# Patient Record
Sex: Female | Born: 1990 | Race: Black or African American | Hispanic: No | Marital: Single | Smoking: Former smoker
Health system: Southern US, Community
[De-identification: ages and names within clinical notes are randomized; demographics above are authoritative.]

## PROBLEM LIST (undated history)

## (undated) DIAGNOSIS — Z789 Other specified health status: Secondary | ICD-10-CM

## (undated) HISTORY — PX: WISDOM TOOTH EXTRACTION: SHX21

---

## 2011-09-13 ENCOUNTER — Emergency Department (HOSPITAL_COMMUNITY): Payer: Medicaid Other

## 2011-09-13 ENCOUNTER — Emergency Department (HOSPITAL_COMMUNITY)
Admission: EM | Admit: 2011-09-13 | Discharge: 2011-09-13 | Disposition: A | Discharge status: Home / Self Care | Payer: Medicaid Other | Attending: Emergency Medicine | Admitting: Emergency Medicine

## 2011-09-13 DIAGNOSIS — R6889 Other general symptoms and signs: Secondary | ICD-10-CM | POA: Insufficient documentation

## 2011-09-13 DIAGNOSIS — R634 Abnormal weight loss: Secondary | ICD-10-CM | POA: Insufficient documentation

## 2011-09-13 DIAGNOSIS — J069 Acute upper respiratory infection, unspecified: Secondary | ICD-10-CM

## 2011-09-13 DIAGNOSIS — J3489 Other specified disorders of nose and nasal sinuses: Secondary | ICD-10-CM | POA: Insufficient documentation

## 2011-09-13 DIAGNOSIS — R070 Pain in throat: Secondary | ICD-10-CM | POA: Insufficient documentation

## 2011-09-13 DIAGNOSIS — R059 Cough, unspecified: Secondary | ICD-10-CM | POA: Insufficient documentation

## 2011-09-13 DIAGNOSIS — Z79899 Other long term (current) drug therapy: Secondary | ICD-10-CM | POA: Insufficient documentation

## 2011-09-13 DIAGNOSIS — R05 Cough: Secondary | ICD-10-CM | POA: Insufficient documentation

## 2011-09-13 LAB — RAPID STREP SCREEN (MED CTR MEBANE ONLY): Streptococcus, Group A Screen (Direct): NEGATIVE

## 2011-09-13 MED ORDER — ALBUTEROL SULFATE HFA 108 (90 BASE) MCG/ACT IN AERS
2.0000 | INHALATION_SPRAY | Freq: Four times a day (QID) | RESPIRATORY_TRACT | Status: DC | PRN
  Administered 2011-09-13: 2 via RESPIRATORY_TRACT
  Filled 2011-09-13: qty 6.7

## 2011-09-13 MED ORDER — AEROCHAMBER PLUS W/MASK MISC
Status: AC
  Administered 2011-09-13: 13:00:00
  Filled 2011-09-13: qty 1

## 2011-09-13 MED ORDER — IBUPROFEN 800 MG PO TABS: 800.0000 mg | ORAL_TABLET | Freq: Three times a day (TID) | ORAL | Status: AC

## 2011-09-13 NOTE — ED Notes (Signed)
Pt ambulatory to xray.
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
## 2011-09-16 NOTE — ED Provider Notes (Signed)
Medical screening examination/treatment/procedure(s) were performed by non-physician practitioner and as supervising physician I was immediately available for consultation/collaboration.  Nicholes Stairs, MD 09/16/11 1610

## 2012-01-09 ENCOUNTER — Emergency Department (HOSPITAL_COMMUNITY)
Admission: EM | Admit: 2012-01-09 | Discharge: 2012-01-09 | Disposition: A | Discharge status: Home / Self Care | Payer: Medicaid Other | Attending: Emergency Medicine | Admitting: Emergency Medicine

## 2012-01-09 ENCOUNTER — Emergency Department (HOSPITAL_COMMUNITY): Payer: Medicaid Other

## 2012-01-09 ENCOUNTER — Encounter (HOSPITAL_COMMUNITY): Payer: Self-pay | Admitting: *Deleted

## 2012-01-09 DIAGNOSIS — H53149 Visual discomfort, unspecified: Secondary | ICD-10-CM | POA: Insufficient documentation

## 2012-01-09 DIAGNOSIS — R51 Headache: Secondary | ICD-10-CM | POA: Insufficient documentation

## 2012-01-09 DIAGNOSIS — H1130 Conjunctival hemorrhage, unspecified eye: Secondary | ICD-10-CM | POA: Insufficient documentation

## 2012-01-09 DIAGNOSIS — S0003XA Contusion of scalp, initial encounter: Secondary | ICD-10-CM | POA: Insufficient documentation

## 2012-01-09 DIAGNOSIS — W503XXA Accidental bite by another person, initial encounter: Secondary | ICD-10-CM

## 2012-01-09 DIAGNOSIS — S01512A Laceration without foreign body of oral cavity, initial encounter: Secondary | ICD-10-CM

## 2012-01-09 DIAGNOSIS — S0510XA Contusion of eyeball and orbital tissues, unspecified eye, initial encounter: Secondary | ICD-10-CM

## 2012-01-09 DIAGNOSIS — S0990XA Unspecified injury of head, initial encounter: Secondary | ICD-10-CM | POA: Insufficient documentation

## 2012-01-09 DIAGNOSIS — S63619A Unspecified sprain of unspecified finger, initial encounter: Secondary | ICD-10-CM

## 2012-01-09 DIAGNOSIS — R220 Localized swelling, mass and lump, head: Secondary | ICD-10-CM | POA: Insufficient documentation

## 2012-01-09 DIAGNOSIS — S0083XA Contusion of other part of head, initial encounter: Secondary | ICD-10-CM

## 2012-01-09 DIAGNOSIS — S6390XA Sprain of unspecified part of unspecified wrist and hand, initial encounter: Secondary | ICD-10-CM | POA: Insufficient documentation

## 2012-01-09 DIAGNOSIS — M79609 Pain in unspecified limb: Secondary | ICD-10-CM | POA: Insufficient documentation

## 2012-01-09 DIAGNOSIS — S1093XA Contusion of unspecified part of neck, initial encounter: Secondary | ICD-10-CM | POA: Insufficient documentation

## 2012-01-09 DIAGNOSIS — S01502A Unspecified open wound of oral cavity, initial encounter: Secondary | ICD-10-CM | POA: Insufficient documentation

## 2012-01-09 DIAGNOSIS — S6990XA Unspecified injury of unspecified wrist, hand and finger(s), initial encounter: Secondary | ICD-10-CM | POA: Insufficient documentation

## 2012-01-09 DIAGNOSIS — H538 Other visual disturbances: Secondary | ICD-10-CM | POA: Insufficient documentation

## 2012-01-09 DIAGNOSIS — S61209A Unspecified open wound of unspecified finger without damage to nail, initial encounter: Secondary | ICD-10-CM | POA: Insufficient documentation

## 2012-01-09 MED ORDER — TETANUS-DIPHTHERIA TOXOIDS TD 5-2 LFU IM INJ
0.5000 mL | INJECTION | Freq: Once | INTRAMUSCULAR | Status: AC
  Administered 2012-01-09: 0.5 mL via INTRAMUSCULAR
  Filled 2012-01-09 (×2): qty 0.5

## 2012-01-09 MED ORDER — AMOXICILLIN-POT CLAVULANATE 875-125 MG PO TABS: 1.0000 | ORAL_TABLET | Freq: Two times a day (BID) | ORAL | Status: AC

## 2012-01-09 MED ORDER — FLUORESCEIN SODIUM 1 MG OP STRP
2.0000 | ORAL_STRIP | Freq: Once | OPHTHALMIC | Status: AC
  Administered 2012-01-09: 2 via OPHTHALMIC
  Filled 2012-01-09: qty 1

## 2012-01-09 MED ORDER — LIDOCAINE HCL (PF) 1 % IJ SOLN
5.0000 mL | Freq: Once | INTRAMUSCULAR | Status: DC
  Filled 2012-01-09: qty 5

## 2012-01-09 MED ORDER — IBUPROFEN 600 MG PO TABS: 600.0000 mg | ORAL_TABLET | Freq: Three times a day (TID) | ORAL | Status: DC | PRN

## 2012-01-09 MED ORDER — TETRACAINE HCL 0.5 % OP SOLN
2.0000 [drp] | Freq: Once | OPHTHALMIC | Status: AC
  Administered 2012-01-09: 2 [drp] via OPHTHALMIC
  Filled 2012-01-09: qty 2
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
## 2012-01-09 NOTE — ED Notes (Signed)
Patient transported to X-ray 
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
## 2012-01-09 NOTE — ED Notes (Signed)
Discharge inst given  Voiced understanding 
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
## 2012-12-07 ENCOUNTER — Encounter (HOSPITAL_COMMUNITY): Payer: Self-pay | Admitting: *Deleted

## 2012-12-07 ENCOUNTER — Emergency Department (HOSPITAL_COMMUNITY)
Admission: EM | Admit: 2012-12-07 | Discharge: 2012-12-07 | Discharge status: Against Medical Advice | Payer: Medicaid Other | Attending: Emergency Medicine | Admitting: Emergency Medicine

## 2012-12-07 DIAGNOSIS — J039 Acute tonsillitis, unspecified: Secondary | ICD-10-CM | POA: Insufficient documentation

## 2012-12-07 NOTE — ED Notes (Signed)
Pt reports having swollen tonsils x several weeks.  Denies pain.  Tonsils noted to be red, swollen and white exudate present.

## 2013-01-27 DIAGNOSIS — J3501 Chronic tonsillitis: Secondary | ICD-10-CM | POA: Insufficient documentation

## 2013-01-28 ENCOUNTER — Emergency Department (HOSPITAL_COMMUNITY)
Admission: EM | Admit: 2013-01-28 | Discharge: 2013-01-28 | Disposition: A | Discharge status: Home / Self Care | Payer: Medicaid Other | Attending: Emergency Medicine | Admitting: Emergency Medicine

## 2013-01-28 ENCOUNTER — Encounter (HOSPITAL_COMMUNITY): Payer: Self-pay | Admitting: *Deleted

## 2013-01-28 DIAGNOSIS — J3501 Chronic tonsillitis: Secondary | ICD-10-CM

## 2013-01-28 MED ORDER — DEXAMETHASONE 2 MG PO TABS
10.0000 mg | ORAL_TABLET | Freq: Once | ORAL | Status: AC
  Administered 2013-01-28: 10 mg via ORAL
  Filled 2013-01-28: qty 5

## 2013-01-28 NOTE — ED Notes (Signed)
Patient says she had an allergic reaction to shellfish and she was given antihistamine, and steroids because her throat was getting swollen.  So she went to the emergency department in [facility].  The patient says her tonsils never went back to normal.  Tonight she noticed her tonsils were touching each other and she got concerned.  She says her throa is not sore and she did not have any shellfish.

## 2013-01-28 NOTE — ED Notes (Signed)
Patient is alert and orientedx4.  Patient was explained discharge instructions and they understood them with no questions.   

## 2013-01-28 NOTE — ED Provider Notes (Signed)
History     CSN: [id number]  Arrival date & time 01/27/13  2357   First MD Initiated Contact with Patient 01/28/13 0019      Chief Complaint  Patient presents with  . Sore Throat    (Consider location/radiation/quality/duration/timing/severity/associated sxs/prior treatment) Patient is a 22 y.o. female presenting with pharyngitis. The history is provided by the patient.  Sore Throat The problem has been gradually worsening. Pertinent negatives include no chest pain, congestion, nausea or sore throat. Associated symptoms comments: She presents for evaluation of increased swelling to tonsils bilaterally. It began 2 months ago after being diagnosed with an allergic reaction to shellfish. She has had gradual and progressive swelling since that time. No difficulty swallowing solids or liquids tonight. No difficulty breathing or shortness of breath. Swelling is limited to tonsils without involvement of lips, tongue. Marland Kitchen    History reviewed. No pertinent past medical history.  History reviewed. No pertinent past surgical history.  History reviewed. No pertinent family history.  History  Substance Use Topics  . Smoking status: Not on file  . Smokeless tobacco: Not on file  . Alcohol Use: Yes    OB History   Grav Para Term Preterm Abortions TAB SAB Ect Mult Living                  Review of Systems  HENT: Negative for congestion, sore throat, drooling and trouble swallowing.        See HPI.  Respiratory: Negative for shortness of breath, wheezing and stridor.   Cardiovascular: Negative for chest pain.  Gastrointestinal: Negative for nausea.    Allergies  Shellfish allergy  Home Medications   Current Outpatient Rx  Name  Route  Sig  Dispense  Refill  . ibuprofen (ADVIL,MOTRIN) 600 MG tablet   Oral   Take 1 tablet (600 mg total) by mouth every 8 (eight) hours as needed for pain. For pain.   20 tablet   0     BP 122/78  Pulse 82  Temp(Src) 98.3 F (36.8 C) (Oral)   Resp 18  SpO2 95%  LMP 01/25/2013  Physical Exam  Constitutional: She is oriented to person, place, and time. She appears well-developed and well-nourished. No distress.  HENT:  Head: Normocephalic.  Markedly swollen tonsils bilaterally without significant redness or any exudates. Uvula midline.  Neck: Normal range of motion.  Cardiovascular: Normal rate and regular rhythm.   No murmur heard. Pulmonary/Chest: Effort normal. No stridor. She has no wheezes.  Abdominal: Soft. There is no tenderness.  Neurological: She is alert and oriented to person, place, and time.  Skin: Skin is warm and dry.  Psychiatric: She has a normal mood and affect.    ED Course  Procedures (including critical care time)  Labs Reviewed - No data to display No results found.   No diagnosis found.  1. Tonsillitis   MDM  Doubt sequelae of allergic reaction given time lapse. She has been referred to ENT but financially unable to see them. Reiterated need for follow up for evaluation for potential tonsillectomy. Will give decadron and make another referral. Encouraged to return if symptoms worsen.        Arnoldo Hooker, PA-C 01/28/13 0130

## 2013-01-28 NOTE — ED Provider Notes (Signed)
Medical screening examination/treatment/procedure(s) were performed by non-physician practitioner and as supervising physician I was immediately available for consultation/collaboration.  Sunnie Nielsen, MD 01/28/13 [phone]

## 2013-01-28 NOTE — ED Notes (Signed)
Pt states she was treated for allergic rxn in January and has had swollen tonsils since.  States "they have been touching for a week"

## 2013-08-01 ENCOUNTER — Other Ambulatory Visit (HOSPITAL_COMMUNITY): Payer: Self-pay | Admitting: Otolaryngology

## 2013-08-02 ENCOUNTER — Encounter (HOSPITAL_COMMUNITY): Payer: Self-pay | Admitting: Pharmacy Technician

## 2013-08-07 ENCOUNTER — Encounter (HOSPITAL_COMMUNITY)
Admission: RE | Admit: 2013-08-07 | Discharge: 2013-08-07 | Disposition: A | Discharge status: Home / Self Care | Payer: BC Managed Care – PPO | Source: Ambulatory Visit | Attending: Otolaryngology | Admitting: Otolaryngology

## 2013-08-07 ENCOUNTER — Encounter (HOSPITAL_COMMUNITY): Payer: Self-pay

## 2013-08-07 HISTORY — DX: Other specified health status: Z78.9

## 2013-08-07 LAB — COMPREHENSIVE METABOLIC PANEL
AST: 15 U/L (ref 0–37)
Albumin: 4 g/dL (ref 3.5–5.2)
Calcium: 9.3 mg/dL (ref 8.4–10.5)
Chloride: 102 mEq/L (ref 96–112)
Creatinine, Ser: 0.66 mg/dL (ref 0.50–1.10)
Total Bilirubin: 0.3 mg/dL (ref 0.3–1.2)

## 2013-08-07 LAB — CBC
Hemoglobin: 13.4 g/dL (ref 12.0–15.0)
MCH: 30.5 pg (ref 26.0–34.0)
MCV: 87 fL (ref 78.0–100.0)
RBC: 4.4 MIL/uL (ref 3.87–5.11)

## 2013-08-07 NOTE — Pre-Procedure Instructions (Signed)
Gabriela [name]  08/07/2013  Your procedure is scheduled on:  08-09-2013    Wednesday   Report to [facility] Entrance "A" [address] at 7:00  AM.  Call this number if you have problems the morning of surgery: [phone]   Remember:   Do not eat food or drink liquids after midnight.    Take these medicines the morning of surgery with A SIP OF WATER: none   Do not wear jewelry, make-up or nail polish.  Do not wear lotions, powders, or perfumes.   Do not shave 48 hours prior to surgery.   Do not bring valuables to the hospital.  [facility] is not responsible for any belongings or valuables.               Contacts, dentures or bridgework may not be worn into surgery.  Leave suitcase in the car. After surgery it may be brought to your room.  For patients admitted to the hospital, discharge time is determined by your   treatment team.               Patients discharged the day of surgery will not be allowed to drive home.   Name and phone number of your driver:    Special Instructions: Shower using CHG 2 nights before surgery and the night before surgery.  If you shower the day of surgery use CHG.  Use special wash - you have one bottle of CHG for all showers.  You should use approximately 1/3 of the bottle for each shower.   Please read over the following fact sheets that you were given: Pain Booklet and Surgical Site Infection Prevention

## 2013-08-09 ENCOUNTER — Ambulatory Visit (HOSPITAL_COMMUNITY)
Admission: RE | Admit: 2013-08-09 | Discharge: 2013-08-09 | Disposition: A | Discharge status: Home / Self Care | Payer: BC Managed Care – PPO | Source: Ambulatory Visit | Attending: Otolaryngology | Admitting: Otolaryngology

## 2013-08-09 ENCOUNTER — Ambulatory Visit (HOSPITAL_COMMUNITY): Payer: BC Managed Care – PPO | Admitting: Anesthesiology

## 2013-08-09 ENCOUNTER — Encounter (HOSPITAL_COMMUNITY): Payer: Self-pay | Admitting: Anesthesiology

## 2013-08-09 ENCOUNTER — Encounter (HOSPITAL_COMMUNITY)
Admission: RE | Disposition: A | Discharge status: Home / Self Care | Payer: Self-pay | Source: Ambulatory Visit | Attending: Otolaryngology

## 2013-08-09 ENCOUNTER — Encounter (HOSPITAL_COMMUNITY): Payer: Self-pay | Admitting: *Deleted

## 2013-08-09 DIAGNOSIS — J3503 Chronic tonsillitis and adenoiditis: Secondary | ICD-10-CM | POA: Insufficient documentation

## 2013-08-09 HISTORY — PX: TONSILLECTOMY AND ADENOIDECTOMY: SHX28

## 2013-08-09 SURGERY — TONSILLECTOMY AND ADENOIDECTOMY
Anesthesia: General | Site: Throat | Laterality: Bilateral | Wound class: Clean Contaminated

## 2013-08-09 MED ORDER — ONDANSETRON HCL 4 MG/2ML IJ SOLN
INTRAMUSCULAR | Status: DC | PRN
  Administered 2013-08-09: 4 mg via INTRAVENOUS

## 2013-08-09 MED ORDER — 0.9 % SODIUM CHLORIDE (POUR BTL) OPTIME
TOPICAL | Status: DC | PRN
  Administered 2013-08-09: 1000 mL

## 2013-08-09 MED ORDER — OXYCODONE HCL 5 MG PO TABS
ORAL_TABLET | ORAL | Status: AC
  Administered 2013-08-09: 5 mg via ORAL
  Filled 2013-08-09: qty 1

## 2013-08-09 MED ORDER — HYDROMORPHONE HCL PF 1 MG/ML IJ SOLN
INTRAMUSCULAR | Status: AC
  Administered 2013-08-09: 0.5 mg via INTRAVENOUS
  Filled 2013-08-09: qty 1

## 2013-08-09 MED ORDER — ARTIFICIAL TEARS OP OINT
TOPICAL_OINTMENT | OPHTHALMIC | Status: DC | PRN
  Administered 2013-08-09: 1 via OPHTHALMIC

## 2013-08-09 MED ORDER — OXYCODONE HCL 5 MG/5ML PO SOLN: 5.0000 mg | Freq: Once | ORAL | Status: AC | PRN

## 2013-08-09 MED ORDER — DEXAMETHASONE SODIUM PHOSPHATE 4 MG/ML IJ SOLN
INTRAMUSCULAR | Status: DC | PRN
  Administered 2013-08-09: 10 mg via INTRAVENOUS

## 2013-08-09 MED ORDER — CLINDAMYCIN PHOSPHATE 600 MG/50ML IV SOLN
600.0000 mg | Freq: Once | INTRAVENOUS | Status: AC
  Administered 2013-08-09: 600 mg via INTRAVENOUS
  Filled 2013-08-09: qty 50

## 2013-08-09 MED ORDER — PROPOFOL 10 MG/ML IV BOLUS
INTRAVENOUS | Status: DC | PRN
  Administered 2013-08-09: 200 mg via INTRAVENOUS

## 2013-08-09 MED ORDER — SUCCINYLCHOLINE CHLORIDE 20 MG/ML IJ SOLN
INTRAMUSCULAR | Status: DC | PRN
  Administered 2013-08-09: 100 mg via INTRAVENOUS

## 2013-08-09 MED ORDER — LIDOCAINE HCL (CARDIAC) 20 MG/ML IV SOLN
INTRAVENOUS | Status: DC | PRN
  Administered 2013-08-09: 20 mg via INTRAVENOUS

## 2013-08-09 MED ORDER — FENTANYL CITRATE 0.05 MG/ML IJ SOLN
INTRAMUSCULAR | Status: DC | PRN
  Administered 2013-08-09 (×4): 50 ug via INTRAVENOUS

## 2013-08-09 MED ORDER — HYDROMORPHONE HCL PF 1 MG/ML IJ SOLN
0.2500 mg | INTRAMUSCULAR | Status: DC | PRN
  Administered 2013-08-09 (×2): 0.5 mg via INTRAVENOUS

## 2013-08-09 MED ORDER — OXYCODONE HCL 5 MG PO TABS
5.0000 mg | ORAL_TABLET | Freq: Once | ORAL | Status: AC | PRN
  Administered 2013-08-09: 5 mg via ORAL

## 2013-08-09 MED ORDER — LACTATED RINGERS IV SOLN
INTRAVENOUS | Status: DC | PRN
  Administered 2013-08-09: 08:00:00 via INTRAVENOUS

## 2013-08-09 MED ORDER — OXYMETAZOLINE HCL 0.05 % NA SOLN
NASAL | Status: DC | PRN
  Administered 2013-08-09: 1

## 2013-08-09 MED ORDER — DEXAMETHASONE SODIUM PHOSPHATE 10 MG/ML IJ SOLN
10.0000 mg | Freq: Once | INTRAMUSCULAR | Status: DC
  Filled 2013-08-09: qty 1

## 2013-08-09 MED ORDER — LACTATED RINGERS IV SOLN
INTRAVENOUS | Status: DC
  Administered 2013-08-09: 08:00:00 via INTRAVENOUS

## 2013-08-09 MED ORDER — MIDAZOLAM HCL 5 MG/5ML IJ SOLN
INTRAMUSCULAR | Status: DC | PRN
  Administered 2013-08-09 (×2): 1 mg via INTRAVENOUS

## 2013-08-09 MED ORDER — OXYMETAZOLINE HCL 0.05 % NA SOLN
NASAL | Status: AC
  Filled 2013-08-09: qty 15

## 2013-08-09 SURGICAL SUPPLY — 30 items
CANISTER SUCTION 2500CC (MISCELLANEOUS) ×2 IMPLANT
CATH ROBINSON RED A/P 10FR (CATHETERS) ×2 IMPLANT
CLEANER TIP ELECTROSURG 2X2 (MISCELLANEOUS) ×2 IMPLANT
CLOTH BEACON ORANGE TIMEOUT ST (SAFETY) ×2 IMPLANT
COAGULATOR SUCT SWTCH 10FR 6 (ELECTROSURGICAL) ×2 IMPLANT
CONT SPEC 4OZ CLIKSEAL STRL BL (MISCELLANEOUS) ×4 IMPLANT
ELECT COATED BLADE 2.86 ST (ELECTRODE) ×2 IMPLANT
ELECT REM PT RETURN 9FT ADLT (ELECTROSURGICAL) ×2
ELECT REM PT RETURN 9FT PED (ELECTROSURGICAL)
ELECTRODE REM PT RETRN 9FT PED (ELECTROSURGICAL) IMPLANT
ELECTRODE REM PT RTRN 9FT ADLT (ELECTROSURGICAL) ×1 IMPLANT
GAUZE SPONGE 4X4 16PLY XRAY LF (GAUZE/BANDAGES/DRESSINGS) ×2 IMPLANT
GLOVE SURG SS PI 7.0 STRL IVOR (GLOVE) ×2 IMPLANT
GLOVE SURG SS PI 7.5 STRL IVOR (GLOVE) ×2 IMPLANT
GOWN STRL NON-REIN LRG LVL3 (GOWN DISPOSABLE) ×2 IMPLANT
GOWN STRL REIN XL XLG (GOWN DISPOSABLE) ×2 IMPLANT
KIT BASIN OR (CUSTOM PROCEDURE TRAY) ×2 IMPLANT
KIT ROOM TURNOVER OR (KITS) ×2 IMPLANT
NS IRRIG 1000ML POUR BTL (IV SOLUTION) ×2 IMPLANT
PACK SURGICAL SETUP 50X90 (CUSTOM PROCEDURE TRAY) ×2 IMPLANT
PAD ARMBOARD 7.5X6 YLW CONV (MISCELLANEOUS) ×4 IMPLANT
PENCIL BUTTON HOLSTER BLD 10FT (ELECTRODE) ×2 IMPLANT
SPECIMEN JAR SMALL (MISCELLANEOUS) IMPLANT
SPONGE TONSIL 1 RF SGL (DISPOSABLE) ×2 IMPLANT
SYR BULB 3OZ (MISCELLANEOUS) ×2 IMPLANT
TOWEL OR 17X24 6PK STRL BLUE (TOWEL DISPOSABLE) ×4 IMPLANT
TUBE CONNECTING 12X1/4 (SUCTIONS) ×2 IMPLANT
TUBE SALEM SUMP 12R W/ARV (TUBING) IMPLANT
WATER STERILE IRR 1000ML POUR (IV SOLUTION) ×2 IMPLANT
YANKAUER SUCT BULB TIP NO VENT (SUCTIONS) ×2 IMPLANT

## 2013-08-09 NOTE — H&P (Signed)
08/09/2013  Gabriela [name]  PREOPERATIVE HISTORY AND PHYSICAL  CHIEF COMPLAINT: adenotonsillar hypertrophy, chronic adenotonsillitis  HISTORY: This is a 22 year old who presents with adenotonsillar hypertrophy, chronic adenotonsillitis. She now presents for adenotonsillectomy.  Dr. Emeline Darling, [place] has discussed the risks (bleeding, risks of anesthesia, dental injury, pain, dehydration, scarring), benefits, and alternatives of this procedure. The patient understands the risks and would like to proceed with the procedure. The chances of success of the procedure are >75% and the patient understands this. I personally performed an examination of the patient within 24 hours of the procedure.  PAST MEDICAL HISTORY: Past Medical History  Diagnosis Date  . Medical history non-contributory     PAST SURGICAL HISTORY: Past Surgical History  Procedure Laterality Date  . Wisdom tooth extraction      MEDICATIONS: No current facility-administered medications on file prior to encounter.   No current outpatient prescriptions on file prior to encounter.    ALLERGIES: Allergies  Allergen Reactions  . Shellfish Allergy Itching and Swelling    REACTION:  Swollen tonsils, itching on fingers     SOCIAL HISTORY: History   Social History  . Marital Status: Single    Spouse Name: N/A    Number of Children: N/A  . Years of Education: N/A   Occupational History  . Not on file.   Social History Main Topics  . Smoking status: Current Some Day Smoker  . Smokeless tobacco: Not on file     Comment: smokes sometimes with ETOH consumption  . Alcohol Use: Yes     Comment: on weekends  . Drug Use: Yes    Special: Marijuana  . Sexual Activity: Yes    Birth Control/ Protection: Pill   Other Topics Concern  . Not on file   Social History Narrative  . No narrative on file    FAMILY HISTORY:History reviewed. No pertinent family history.  REVIEW OF SYSTEMS:  HEENT:enlarged tonsils, occasional  sore throat, otherwise negative x 10 systems except per HPI  PHYSICAL EXAM:  GENERAL:  NAD VITAL SIGNS:   Filed Vitals:   08/09/13 0731  BP: 117/72  Pulse: 82  Temp: 97 F (36.1 C)  Resp: 20    SKIN: warm, dry  HEENT:  Tongue mobility normal, tonsillar hypertrophy/3+ tonsils NECK:  supple LYMPH:  No LAD LUNGS:  Grossly clear CARDIOVASCULAR:  RRR ABDOMEN:  soft MUSCULOSKELETAL: normal strength PSYCH:  Normal affect NEUROLOGIC:  CN 2-12 grossly intact and symmetric   ASSESSMENT AND PLAN: Plan to proceed with outpatient adenotonsillectomy. Patient understands the risks, benefits, and alternatives.  08/09/2013  8:01 AM Gabriela [name]

## 2013-08-09 NOTE — Preoperative (Signed)
Beta Blockers   Reason not to administer Beta Blockers:Not Applicable 

## 2013-08-09 NOTE — Op Note (Signed)
DATE OF OPERATION: 08/09/2013 Surgeon: Melvenia Beam Procedure Performed: 40981 bilateral tonsillectomy with adenoidectomy >22 years old  PREOPERATIVE DIAGNOSIS: adenotonsillar hypertrophy, chronic adenotonsillitis POSTOPERATIVE DIAGNOSIS: adenotonsillar hypertrophy, chronic adenotonsillitis SURGEON: Melvenia Beam ANESTHESIA: General endotracheal.  ESTIMATED BLOOD LOSS: less than 50 mL.  DRAINS: none SPECIMENS: right and left tonsil, adenoids not sent INDICATIONS: The patient is a 21yo with a history of adenotonsillar hypertrophy, chronic adenotonsillitis  DESCRIPTION OF OPERATION: The patient was brought to the operating room and was placed in the supine position and was placed under general endotracheal anesthesia by anesthesiology. The bed was turned 90 degrees and the Crowe-Davis mouth retractor was placed over the endotracheal tube and suspended from the Mayo stand. The palate was inspected and palpated and noted to be intact with no submucous cleft. The uvula was normal. The adenoids were inspected with a dental mirror and noted to be hypertrophic. The adenoids were removed with the suction Bovie cautery. Meticulous hemostasis was then obtained on the adenoid pad using the suction Bovie.  Next the right tonsil was grasped with a curved Allis clamp and dissected from the right tonsillar fossa using the Bovie. Branches of the ascending pharyngeal artery were encountered and thoroughly cauterized as needed. Meticulous hemostasis was then achieved. The left tonsil was then grasped with the curved Allis and dissected from the left tonsillar fossa using the Bovie. Meticulous hemostasis was achieved. The nasal cavity and oropharynx were irrigated out and then the the nose, oral cavity,  and stomach were suctioned out. The patient was turned back to anesthesia and awakened from anesthesia and extubated without difficulty. The patient tolerated the procedure well with no immediate complications and was  taken to the postoperative recovery area in good condition.   Dr. Melvenia Beam was present and performed the entire procedure. 08/09/2013  9:29 AM Melvenia Beam

## 2013-08-09 NOTE — Anesthesia Preprocedure Evaluation (Signed)
Anesthesia Evaluation  Patient identified by MRN, date of birth, ID band Patient awake    Reviewed: Allergy & Precautions, H&P , NPO status , Patient's Chart, lab work & pertinent test results  Airway       Dental no notable dental hx.    Pulmonary neg pulmonary ROS,    Pulmonary exam normal       Cardiovascular negative cardio ROS      Neuro/Psych negative neurological ROS  negative psych ROS   GI/Hepatic negative GI ROS, Neg liver ROS,   Endo/Other  negative endocrine ROS  Renal/GU negative Renal ROS  negative genitourinary   Musculoskeletal   Abdominal   Peds  Hematology negative hematology ROS (+)   Anesthesia Other Findings   Reproductive/Obstetrics negative OB ROS                           Anesthesia Physical Anesthesia Plan  ASA: I  Anesthesia Plan: General   Post-op Pain Management:    Induction: Intravenous  Airway Management Planned: Oral ETT  Additional Equipment:   Intra-op Plan:   Post-operative Plan: Extubation in OR  Informed Consent: I have reviewed the patients History and Physical, chart, labs and discussed the procedure including the risks, benefits and alternatives for the proposed anesthesia with the patient or authorized representative who has indicated his/her understanding and acceptance.   Dental advisory given  Plan Discussed with: CRNA  Anesthesia Plan Comments:         Anesthesia Quick Evaluation

## 2013-08-09 NOTE — Anesthesia Procedure Notes (Signed)
Procedure Name: Intubation Date/Time: 08/09/2013 8:34 AM Performed by: Gayla Medicus Pre-anesthesia Checklist: Patient identified, Timeout performed, Emergency Drugs available, Suction available and Patient being monitored Patient Re-evaluated:Patient Re-evaluated prior to inductionOxygen Delivery Method: Circle system utilized Preoxygenation: Pre-oxygenation with 100% oxygen Intubation Type: IV induction Ventilation: Mask ventilation without difficulty Laryngoscope Size: Mac and 3 Grade View: Grade I Tube type: Oral Tube size: 7.0 mm Number of attempts: 1 Airway Equipment and Method: Stylet Placement Confirmation: ETT inserted through vocal cords under direct vision,  positive ETCO2 and breath sounds checked- equal and bilateral Secured at: 22 cm Tube secured with: Tape Dental Injury: Teeth and Oropharynx as per pre-operative assessment

## 2013-08-09 NOTE — Transfer of Care (Signed)
Immediate Anesthesia Transfer of Care Note  Patient: Gabriela [name]  Procedure(s) Performed: Procedure(s): TONSILLECTOMY AND ADENOIDECTOMY (Bilateral)  Patient Location: PACU  Anesthesia Type:General  Level of Consciousness: awake, alert  and oriented  Airway & Oxygen Therapy: Patient Spontanous Breathing and Patient connected to nasal cannula oxygen  Post-op Assessment: Report given to PACU RN, Post -op Vital signs reviewed and stable and Patient moving all extremities X 4  Post vital signs: Reviewed and stable  Complications: No apparent anesthesia complications

## 2013-08-09 NOTE — Anesthesia Postprocedure Evaluation (Signed)
  Anesthesia Post-op Note  Patient: Gabriela [name]  Procedure(s) Performed: Procedure(s): TONSILLECTOMY AND ADENOIDECTOMY (Bilateral)  Patient Location: PACU  Anesthesia Type:General  Level of Consciousness: awake  Airway and Oxygen Therapy: Patient Spontanous Breathing and Patient connected to nasal cannula oxygen  Post-op Pain: mild  Post-op Assessment: Post-op Vital signs reviewed, Patient's Cardiovascular Status Stable, Respiratory Function Stable, Patent Airway and No signs of Nausea or vomiting  Post-op Vital Signs: Reviewed and stable  Complications: No apparent anesthesia complications

## 2013-08-09 NOTE — Discharge Summary (Signed)
08/09/2013  1:18 PM  Date of Admission:08/09/2013 Date of Discharge:08/09/2013  Discharge JX:BJYN, Clovis Riley, MD  Admitting WG:NFAO, Clovis Riley, MD  Reason for admission/final discharge diagnosis:adenotonsillar hypertrophy, chronic adenotonsillitis  Labs:see EPIC  Procedure(s) performed:adenotonsillectomy 08/09/13  Discharge Condition: stable  Discharge Exam:oral cavity hemostatic with tonsils surgically absent, awake, alert, CN 2-12 intact and symmetric  Discharge Instructions: No heavy lifting x 2 weeks, post-tonsillectomy or full liquid diet, follow up with  Dr. Em[facility]ling [facility] in 3-4 weeks. Rx for hydrocodone on chart, Rx for Zofran and antibiotic sent to pharmacy.  Hospital Course: had outpatient tonsillectomy on 08/09/13, discharged from PACU in good condition after surgery  Melvenia Beam 1:18 PM 08/09/2013

## 2013-08-11 ENCOUNTER — Encounter (HOSPITAL_COMMUNITY): Payer: Self-pay | Admitting: Otolaryngology

## 2014-05-07 ENCOUNTER — Emergency Department (HOSPITAL_COMMUNITY)
Admission: EM | Admit: 2014-05-07 | Discharge: 2014-05-07 | Discharge status: Absent without leave | Payer: BC Managed Care – PPO | Source: Home / Self Care

## 2016-12-04 ENCOUNTER — Emergency Department (HOSPITAL_COMMUNITY)
Admission: EM | Admit: 2016-12-04 | Discharge: 2016-12-04 | Disposition: A | Discharge status: Home / Self Care | Payer: Self-pay | Attending: Emergency Medicine | Admitting: Emergency Medicine

## 2016-12-04 ENCOUNTER — Encounter (HOSPITAL_COMMUNITY): Payer: Self-pay | Admitting: Emergency Medicine

## 2016-12-04 DIAGNOSIS — Z5181 Encounter for therapeutic drug level monitoring: Secondary | ICD-10-CM | POA: Insufficient documentation

## 2016-12-04 DIAGNOSIS — M62838 Other muscle spasm: Secondary | ICD-10-CM

## 2016-12-04 DIAGNOSIS — F17200 Nicotine dependence, unspecified, uncomplicated: Secondary | ICD-10-CM | POA: Insufficient documentation

## 2016-12-04 DIAGNOSIS — M62830 Muscle spasm of back: Secondary | ICD-10-CM | POA: Insufficient documentation

## 2016-12-04 LAB — ETHANOL: Alcohol, Ethyl (B): <5 mg/dL

## 2016-12-04 LAB — PREGNANCY, URINE: Preg Test, Ur: NEGATIVE

## 2016-12-04 LAB — RAPID URINE DRUG SCREEN, HOSP PERFORMED
Amphetamines: NOT DETECTED
Barbiturates: NOT DETECTED
Benzodiazepines: NOT DETECTED
Cocaine: NOT DETECTED
Opiates: POSITIVE — AB
Tetrahydrocannabinol: POSITIVE — AB

## 2016-12-04 LAB — I-STAT CHEM 8, ED
BUN: 6 mg/dL (ref 6–20)
Calcium, Ion: 1.26 mmol/L (ref 1.15–1.40)
Chloride: 104 mmol/L (ref 101–111)
Creatinine, Ser: 0.8 mg/dL (ref 0.44–1.00)
Glucose, Bld: 87 mg/dL (ref 65–99)
HCT: 38 % (ref 36.0–46.0)
Hemoglobin: 12.9 g/dL (ref 12.0–15.0)
Potassium: 3.8 mmol/L (ref 3.5–5.1)
Sodium: 140 mmol/L (ref 135–145)
TCO2: 26 mmol/L (ref 0–100)

## 2016-12-04 LAB — URINALYSIS, ROUTINE W REFLEX MICROSCOPIC
Bilirubin Urine: NEGATIVE
Glucose, UA: NEGATIVE mg/dL
Ketones, ur: NEGATIVE mg/dL
Leukocytes, UA: NEGATIVE
Nitrite: NEGATIVE
Protein, ur: NEGATIVE mg/dL
Specific Gravity, Urine: 1.01 (ref 1.005–1.030)
pH: 6 (ref 5.0–8.0)

## 2016-12-04 LAB — ACETAMINOPHEN LEVEL: Acetaminophen (Tylenol), Serum: <10 ug/mL — ABNORMAL LOW (ref 10–30)

## 2016-12-04 LAB — SALICYLATE LEVEL: Salicylate Lvl: <7 mg/dL (ref 2.8–30.0)

## 2016-12-04 LAB — CK: Total CK: 147 U/L (ref 38–234)

## 2016-12-04 MED ORDER — ONDANSETRON HCL 4 MG/2ML IJ SOLN
4.0000 mg | Freq: Once | INTRAMUSCULAR | Status: AC
  Administered 2016-12-04: 4 mg via INTRAVENOUS
  Filled 2016-12-04: qty 2

## 2016-12-04 MED ORDER — SODIUM CHLORIDE 0.9 % IV BOLUS (SEPSIS)
1000.0000 mL | Freq: Once | INTRAVENOUS | Status: AC
  Administered 2016-12-04: 1000 mL via INTRAVENOUS

## 2016-12-04 NOTE — ED Triage Notes (Addendum)
Patient reports unable to sleep over the past few days. Patient also reports that when she tries to sleep she gets muscle spasms all over then break out in sweat.  Patient reports even trying to take Benadryl to help her sleep. Patient states that she had them when she was younger and would happen after she ate tomatoes but hasnt eaten any recently.

## 2016-12-04 NOTE — ED Provider Notes (Signed)
[facility]-EMERGENCY DEPT Provider Note   CSN: [id number] Arrival date & time: 12/04/16  0343     History   Chief Complaint Chief Complaint  Patient presents with  . Insomnia  . Spasms    HPI Gabriela [name] is a 26 y.o. female who was previously healthy who presents with a one-week history of intermittent headaches and insomnia. Patient reports when she was trying to sleep tonight she began having a transient shocklike sensation to her body that only lasted a few seconds. The patient reports she has had difficulty laying still. Patient reports she has had intermittent headaches throughout the day, not at night. Patient reports some associated nausea, but no vomiting. Patient also denies any vision changes, numbness, tingling. 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 She denies any heavy exertion. Patient reports taking 100 mg of Benadryl tonight after her shocklike sensations occurred. Patient denies any fevers, neck pain, chest pain, shortness of breath, abdominal pain, dysuria. Patient denies state her urine has been darker. Patient denies other drug use.  HPI  Past Medical History:  Diagnosis Date  . Medical history non-contributory     There are no active problems to display for this patient.   Past Surgical History:  Procedure Laterality Date  . TONSILLECTOMY AND ADENOIDECTOMY Bilateral 08/09/2013   Procedure: TONSILLECTOMY AND ADENOIDECTOMY;  Surgeon: Melvenia Beam, MD;  Location: [facility] OR;  Service: ENT;  Laterality: Bilateral;  . WISDOM TOOTH EXTRACTION      OB History    No data available       Home Medications    Prior to Admission medications   Not on File    Family History No family history on file.  Social History Social History  Substance Use Topics  . Smoking status: Current Some Day Smoker  . Smokeless tobacco: Not on file     Comment: smokes sometimes with ETOH consumption  . Alcohol use Yes     Comment: on weekends       Allergies   Shellfish allergy   Review of Systems Review of Systems  Constitutional: Negative for chills and fever.  HENT: Negative for facial swelling and sore throat.   Respiratory: Negative for shortness of breath.   Cardiovascular: Negative for chest pain.  Gastrointestinal: Negative for abdominal pain, nausea and vomiting.  Genitourinary: Negative for dysuria.  Musculoskeletal: Negative for back pain.  Skin: Negative for rash and wound.  Neurological: Positive for headaches. Negative for numbness.  Psychiatric/Behavioral: The patient is not nervous/anxious.      Physical Exam Updated Vital Signs BP 124/96 (BP Location: Left Arm)   Pulse 79   Temp 98.3 F (36.8 C) (Oral)   Resp 17   LMP 11/27/2016   SpO2 99%   Physical Exam  Constitutional: She appears well-developed and well-nourished. No distress.  HENT:  Head: Normocephalic and atraumatic.  Mouth/Throat: Oropharynx is clear and moist. No oropharyngeal exudate.  Eyes: Conjunctivae and EOM are normal. Pupils are equal, round, and reactive to light. Right eye exhibits no discharge. Left eye exhibits no discharge. No scleral icterus.  Neck: Normal range of motion. Neck supple. No thyromegaly present.  Cardiovascular: Normal rate, regular rhythm, normal heart sounds and intact distal pulses.  Exam reveals no gallop and no friction rub.   No murmur heard. Pulmonary/Chest: Effort normal and breath sounds normal. No stridor. No respiratory distress. She has no wheezes. She has no rales.  Abdominal: Soft. Bowel sounds are normal. She  exhibits no distension. There is no tenderness. There is no rebound and no guarding.  Musculoskeletal: She exhibits no edema.  Lymphadenopathy:    She has no cervical adenopathy.  Neurological: She is alert. Coordination normal.  CN 3-12 intact; normal sensation throughout; 5/5 strength in all 4 extremities; equal bilateral grip strength; no ataxia on finger-to-nose  Skin: Skin is warm  and dry. No rash noted. She is not diaphoretic. No pallor.  Psychiatric: She has a normal mood and affect.  Nursing note and vitals reviewed.    ED Treatments / Results  Labs (all labs ordered are listed, but only abnormal results are displayed) Labs Reviewed  ETHANOL  SALICYLATE LEVEL  ACETAMINOPHEN LEVEL  RAPID URINE DRUG SCREEN, HOSP PERFORMED  URINALYSIS, ROUTINE W REFLEX MICROSCOPIC  PREGNANCY, URINE  I-STAT CHEM 8, ED    EKG  EKG Interpretation None       Radiology No results found.  Procedures Procedures (including critical care time)  Medications Ordered in ED Medications - No data to display   Initial Impression / Assessment and Plan / ED Course  I have reviewed the triage vital signs and the nursing notes.  Pertinent labs & imaging results that were available during my care of the patient were reviewed by me and considered in my medical decision making (see chart for details).     I-stat Chem 8 WNL. UDS shows positive opiates, positive THC. Negative alcohol, salicylate, Tylenol. UA shows large hematuria, however 0-30 rbc's. Concern for myoglobinuria. CK sent and pending. Fluids initiated in the ED. At shift change, patient care transferred to Fayrene HelperBowie Tran, PA-C for continued evaluation, follow up of CK and determination of disposition.     Final Clinical Impressions(s) / ED Diagnoses   Final diagnoses:  None    New Prescriptions New Prescriptions   No medications on file     Verdis Primelexandra M Keara Pagliarulo, PA-C 12/04/16 [id number]    April Palumbo, MD 12/04/16 [phone]

## 2016-12-04 NOTE — ED Provider Notes (Signed)
Received sign out at beginning of shift.  Pt here with insomnia and muscle spasm.  Labs remarkable for UDS positive of opiate and THC, pt denies using both.  UA with evidence of myoglobinuria however total CK is normal.  Pt felt better with IVF and comfortable to be d/c.  Recommend avoid drug use as it may contribute to her sxs.  Return precaution discussed.    BP 133/88   Pulse 72   Temp 98.3 F (36.8 C) (Oral)   Resp 18   LMP 11/27/2016   SpO2 99%   Results for orders placed or performed during the hospital encounter of 12/04/16  Ethanol  Result Value Ref Range   Alcohol, Ethyl (B) <5 <5 mg/dL  Salicylate level  Result Value Ref Range   Salicylate Lvl <7.0 2.8 - 30.0 mg/dL  Acetaminophen level  Result Value Ref Range   Acetaminophen (Tylenol), Serum <10 (L) 10 - 30 ug/mL  Rapid urine drug screen (hospital performed)  Result Value Ref Range   Opiates POSITIVE (A) NONE DETECTED   Cocaine NONE DETECTED NONE DETECTED   Benzodiazepines NONE DETECTED NONE DETECTED   Amphetamines NONE DETECTED NONE DETECTED   Tetrahydrocannabinol POSITIVE (A) NONE DETECTED   Barbiturates NONE DETECTED NONE DETECTED  Urinalysis, Routine w reflex microscopic  Result Value Ref Range   Color, Urine YELLOW YELLOW   APPearance CLEAR CLEAR   Specific Gravity, Urine 1.010 1.005 - 1.030   pH 6.0 5.0 - 8.0   Glucose, UA NEGATIVE NEGATIVE mg/dL   Hgb urine dipstick LARGE (A) NEGATIVE   Bilirubin Urine NEGATIVE NEGATIVE   Ketones, ur NEGATIVE NEGATIVE mg/dL   Protein, ur NEGATIVE NEGATIVE mg/dL   Nitrite NEGATIVE NEGATIVE   Leukocytes, UA NEGATIVE NEGATIVE   RBC / HPF 0-5 0 - 5 RBC/hpf   WBC, UA 0-5 0 - 5 WBC/hpf   Bacteria, UA RARE (A) NONE SEEN   Squamous Epithelial / LPF 0-5 (A) NONE SEEN   Mucous PRESENT   Pregnancy, urine  Result Value Ref Range   Preg Test, Ur NEGATIVE NEGATIVE  CK  Result Value Ref Range   Total CK 147 38 - 234 U/L  I-Stat Chem 8, ED  Result Value Ref Range   Sodium 140  135 - 145 mmol/L   Potassium 3.8 3.5 - 5.1 mmol/L   Chloride 104 101 - 111 mmol/L   BUN 6 6 - 20 mg/dL   Creatinine, Ser 1.610.80 0.44 - 1.00 mg/dL   Glucose, Bld 87 65 - 99 mg/dL   Calcium, Ion 0.961.26 0.451.15 - 1.40 mmol/L   TCO2 26 0 - 100 mmol/L   Hemoglobin 12.9 12.0 - 15.0 g/dL   HCT 40.938.0 81.136.0 - 91.446.0 %   No results found.    Fayrene HelperBowie Daylan Juhnke, PA-C 12/04/16 [id number]    Gwyneth SproutWhitney Plunkett, MD 12/04/16 [phone]

## 2018-05-22 ENCOUNTER — Emergency Department (HOSPITAL_COMMUNITY)
Admission: EM | Admit: 2018-05-22 | Discharge: 2018-05-23 | Disposition: A | Discharge status: Home / Self Care | Payer: BLUE CROSS/BLUE SHIELD | Attending: Emergency Medicine | Admitting: Emergency Medicine

## 2018-05-22 ENCOUNTER — Other Ambulatory Visit: Payer: Self-pay

## 2018-05-22 ENCOUNTER — Encounter (HOSPITAL_COMMUNITY): Payer: Self-pay

## 2018-05-22 ENCOUNTER — Emergency Department (HOSPITAL_COMMUNITY): Payer: BLUE CROSS/BLUE SHIELD

## 2018-05-22 DIAGNOSIS — R1033 Periumbilical pain: Secondary | ICD-10-CM | POA: Diagnosis not present

## 2018-05-22 DIAGNOSIS — N730 Acute parametritis and pelvic cellulitis: Secondary | ICD-10-CM

## 2018-05-22 DIAGNOSIS — R109 Unspecified abdominal pain: Secondary | ICD-10-CM

## 2018-05-22 DIAGNOSIS — R102 Pelvic and perineal pain: Secondary | ICD-10-CM | POA: Diagnosis not present

## 2018-05-22 DIAGNOSIS — N739 Female pelvic inflammatory disease, unspecified: Secondary | ICD-10-CM | POA: Insufficient documentation

## 2018-05-22 DIAGNOSIS — F17210 Nicotine dependence, cigarettes, uncomplicated: Secondary | ICD-10-CM | POA: Diagnosis not present

## 2018-05-22 DIAGNOSIS — R1032 Left lower quadrant pain: Secondary | ICD-10-CM | POA: Diagnosis not present

## 2018-05-22 DIAGNOSIS — R1031 Right lower quadrant pain: Secondary | ICD-10-CM | POA: Diagnosis not present

## 2018-05-22 DIAGNOSIS — R1030 Lower abdominal pain, unspecified: Secondary | ICD-10-CM | POA: Diagnosis not present

## 2018-05-22 LAB — URINALYSIS, ROUTINE W REFLEX MICROSCOPIC
BILIRUBIN URINE: NEGATIVE
Glucose, UA: NEGATIVE mg/dL
KETONES UR: 5 mg/dL — AB
Nitrite: NEGATIVE
Protein, ur: 30 mg/dL — AB
Specific Gravity, Urine: 1.026 (ref 1.005–1.030)
WBC, UA: >50 WBC/hpf — ABNORMAL HIGH (ref 0–5)
pH: 6 (ref 5.0–8.0)

## 2018-05-22 LAB — COMPREHENSIVE METABOLIC PANEL
ALT: 9 U/L (ref 0–44)
AST: 17 U/L (ref 15–41)
Albumin: 3.6 g/dL (ref 3.5–5.0)
Alkaline Phosphatase: 62 U/L (ref 38–126)
Anion gap: 10 (ref 5–15)
BUN: 9 mg/dL (ref 6–20)
CHLORIDE: 108 mmol/L (ref 98–111)
CO2: 27 mmol/L (ref 22–32)
CREATININE: 0.58 mg/dL (ref 0.44–1.00)
Calcium: 8.7 mg/dL — ABNORMAL LOW (ref 8.9–10.3)
GFR calc non Af Amer: >60 mL/min (ref >60)
Glucose, Bld: 93 mg/dL (ref 70–99)
Potassium: 3.9 mmol/L (ref 3.5–5.1)
SODIUM: 145 mmol/L (ref 135–145)
Total Bilirubin: 0.5 mg/dL (ref 0.3–1.2)
Total Protein: 6.9 g/dL (ref 6.5–8.1)

## 2018-05-22 LAB — I-STAT BETA HCG BLOOD, ED (MC, WL, AP ONLY): I-stat hCG, quantitative: 205.8 m[IU]/mL — ABNORMAL HIGH (ref <5)

## 2018-05-22 LAB — CBC
HCT: 34.5 % — ABNORMAL LOW (ref 36.0–46.0)
Hemoglobin: 11.6 g/dL — ABNORMAL LOW (ref 12.0–15.0)
MCH: 30.9 pg (ref 26.0–34.0)
MCHC: 33.6 g/dL (ref 30.0–36.0)
MCV: 91.8 fL (ref 78.0–100.0)
Platelets: 311 10*3/uL (ref 150–400)
RBC: 3.76 MIL/uL — AB (ref 3.87–5.11)
RDW: 14.5 % (ref 11.5–15.5)
WBC: 15.4 10*3/uL — AB (ref 4.0–10.5)

## 2018-05-22 LAB — LIPASE, BLOOD: LIPASE: 22 U/L (ref 11–51)

## 2018-05-22 LAB — WET PREP, GENITAL
CLUE CELLS WET PREP: NONE SEEN
SPERM: NONE SEEN
Yeast Wet Prep HPF POC: NONE SEEN

## 2018-05-22 LAB — I-STAT CG4 LACTIC ACID, ED: Lactic Acid, Venous: 0.82 mmol/L (ref 0.5–1.9)

## 2018-05-22 MED ORDER — ONDANSETRON HCL 4 MG/2ML IJ SOLN
4.0000 mg | Freq: Once | INTRAMUSCULAR | Status: AC
  Administered 2018-05-22: 4 mg via INTRAVENOUS
  Filled 2018-05-22: qty 2

## 2018-05-22 MED ORDER — PIPERACILLIN-TAZOBACTAM 3.375 G IVPB
3.3750 g | Freq: Three times a day (TID) | INTRAVENOUS | Status: DC
  Administered 2018-05-22: 3.375 g via INTRAVENOUS
  Filled 2018-05-22: qty 50

## 2018-05-22 MED ORDER — SODIUM CHLORIDE 0.9 % IV BOLUS
1000.0000 mL | Freq: Once | INTRAVENOUS | Status: AC
  Administered 2018-05-22: 1000 mL via INTRAVENOUS

## 2018-05-22 MED ORDER — MORPHINE SULFATE (PF) 4 MG/ML IV SOLN
4.0000 mg | Freq: Once | INTRAVENOUS | Status: AC
  Administered 2018-05-22: 4 mg via INTRAVENOUS
  Filled 2018-05-22: qty 1

## 2018-05-22 MED ORDER — ACETAMINOPHEN 500 MG PO TABS
1000.0000 mg | ORAL_TABLET | Freq: Once | ORAL | Status: AC
  Administered 2018-05-22: 1000 mg via ORAL
  Filled 2018-05-22: qty 2

## 2018-05-22 NOTE — ED Triage Notes (Signed)
Pt coming from home c/o abdominal pain in LRQ and LLQ that started 2-3 days ago. 8/10 sharp pain. Last BM was 1 week ago. Reports she tried taking laxatives to help. HX of medical abortion 2 weeks ago.

## 2018-05-22 NOTE — ED Provider Notes (Addendum)
[facility]-EMERGENCY DEPT Provider Note   CSN: [id number] Arrival date & time: 05/22/18  1840     History   Chief Complaint Chief Complaint  Patient presents with  . Abdominal Pain    HPI Gabriela [name] is a 27 y.o. female.  HPI 27 year old African-American female with no pertinent past medical history presents to the emergency department today complaining of lower abdominal pain and fever.  Patient reports that she had a medical abortion 2 weeks ago.  Patient reports that vaginal bleeding has significantly improved and only has light spotting at this time.  She reports that 2 days ago she developed lower abdominal pain.  Describes as sharp in nature.  Pain does not radiate.  Pain is worse with palpation and movement.  Denies any vaginal discharge.  Denies any urinary symptoms.  Does report not having a bowel movement in 1 week.  Reports loose stools today after taking a laxative.  Patient reports nausea but denies any emesis.  Reports that fever started today.  She took ibuprofen for her pain and fever with some relief.  Patient does not have an OB/GYN doctor.  She is sexually active but has no concern for STD.  Pt denies any  ha, vision changes, lightheadedness, dizziness, congestion, neck pain, cp, sob, cough, urinary symptoms, change in bowel habits, melena, hematochezia, lower extremity paresthesias.  Past Medical History:  Diagnosis Date  . Medical history non-contributory     There are no active problems to display for this patient.   Past Surgical History:  Procedure Laterality Date  . TONSILLECTOMY AND ADENOIDECTOMY Bilateral 08/09/2013   Procedure: TONSILLECTOMY AND ADENOIDECTOMY;  Surgeon: Melvenia Beam, MD;  Location: [facility] OR;  Service: ENT;  Laterality: Bilateral;  . WISDOM TOOTH EXTRACTION       OB History   None      Home Medications    Prior to Admission medications   Medication Sig Start Date End Date Taking? Authorizing Provider    acetaminophen (TYLENOL) 500 MG tablet Take 500 mg by mouth every 6 (six) hours as needed for mild pain or moderate pain.    [provider]  Multiple Vitamins-Minerals (WOMENS MULTI VITAMIN & MINERAL PO) Take 2 tablets by mouth daily.    [provider]    Family History No family history on file.  Social History Social History   Tobacco Use  . Smoking status: Current Some Day Smoker  . Tobacco comment: smokes sometimes with ETOH consumption  Substance Use Topics  . Alcohol use: Yes    Comment: on weekends  . Drug use: Yes    Types: Marijuana     Allergies   Shellfish allergy   Review of Systems Review of Systems  All other systems reviewed and are negative.    Physical Exam Updated Vital Signs BP 102/62   Pulse 85   Temp 98.3 F (36.8 C) (Oral)   Resp 16   Ht 5\' 8"  (1.727 m)   Wt 70.8 kg (156 lb)   LMP 04/03/2018 (Approximate)   SpO2 96%   BMI 23.72 kg/m   Physical Exam  Constitutional: She is oriented to person, place, and time. She appears well-developed and well-nourished.  Non-toxic appearance. No distress.  HENT:  Head: Normocephalic and atraumatic.  Nose: Nose normal.  Mouth/Throat: Oropharynx is clear and moist.  Eyes: Pupils are equal, round, and reactive to light. Conjunctivae are normal. Right eye exhibits no discharge. Left eye exhibits no discharge.  Neck: Normal range of  motion. Neck supple.  Cardiovascular: Normal rate, regular rhythm, normal heart sounds and intact distal pulses.  Pulmonary/Chest: Effort normal and breath sounds normal. No respiratory distress. She exhibits no tenderness.  Abdominal: Soft. Normal appearance and bowel sounds are normal. There is tenderness in the right lower quadrant, suprapubic area and left lower quadrant. There is no rigidity, no rebound, no guarding, no CVA tenderness, no tenderness at McBurney's point and negative Murphy's sign.  Genitourinary:  Genitourinary Comments: Chaperone present  for exam. No external lesions, swelling, erythema, or rash of the labia. No erythema, bleeding, or lesions noted in the vaginal vault.  Brown discharge noted the vaginal vault.  No CMT tenderness, bleeding or friability.  The lateral adnexal tenderness, no mass or fullness bilaterally. No inguinal adenopathy or hernia.    Musculoskeletal: Normal range of motion. She exhibits no tenderness.  Lymphadenopathy:    She has no cervical adenopathy.  Neurological: She is alert and oriented to person, place, and time.  Skin: Skin is warm and dry. Capillary refill takes less than 2 seconds.  Psychiatric: Her behavior is normal. Judgment and thought content normal.  Nursing note and vitals reviewed.    ED Treatments / Results  Labs (all labs ordered are listed, but only abnormal results are displayed) Labs Reviewed  WET PREP, GENITAL - Abnormal; Notable for the following components:      Result Value   Trich, Wet Prep PRESENT (*)    WBC, Wet Prep HPF POC MANY (*)    All other components within normal limits  COMPREHENSIVE METABOLIC PANEL - Abnormal; Notable for the following components:   Calcium 8.7 (*)    All other components within normal limits  CBC - Abnormal; Notable for the following components:   WBC 15.4 (*)    RBC 3.76 (*)    Hemoglobin 11.6 (*)    HCT 34.5 (*)    All other components within normal limits  URINALYSIS, ROUTINE W REFLEX MICROSCOPIC - Abnormal; Notable for the following components:   APPearance HAZY (*)    Hgb urine dipstick MODERATE (*)    Ketones, ur 5 (*)    Protein, ur 30 (*)    Leukocytes, UA LARGE (*)    RBC / HPF >50 (*)    WBC, UA >50 (*)    Bacteria, UA RARE (*)    All other components within normal limits  I-STAT BETA HCG BLOOD, ED ([facility], [facility], [facility] ONLY) - Abnormal; Notable for the following components:   I-stat hCG, quantitative 205.8 (*)    All other components within normal limits  LIPASE, BLOOD  I-STAT CG4 LACTIC ACID, ED  ABO/RH  GC/CHLAMYDIA PROBE  AMP ([facility]) NOT AT [facility]    EKG None  Radiology US Ob Comp < 14 Wks  Result Date: 05/22/2018 CLINICAL DATA:  Recent abortion. Beta HCG 205. Pelvic pain and fever x3 days. EXAM: TRANSABDOMINAL AND TRANSVAGINAL ULTRASOUND OF PELVIS DOPPLER ULTRASOUND OF OVARIES TECHNIQUE: Both transabdominal and transvaginal ultrasound examinations of the pelvis were performed. Transabdominal technique was performed for global imaging of the pelvis including uterus, ovaries, adnexal regions, and pelvic cul-de-sac. It was necessary to proceed with endovaginal exam following the transabdominal exam to visualize the ovaries. Color and duplex Doppler ultrasound was utilized to evaluate blood flow to the ovaries. COMPARISON:  None. FINDINGS: Uterus Measurements: Anteverted uterus without focal mass. No intrauterine or ectopic pregnancy is identified. The uterus measures 8.7 x 3.8 x 4 cm. Endometrium Thickness: 11.7 mm in thickness. No retained  products of conception identified. Right ovary Measurements: 3 x 2.1 x 2.3 cm.  Normal appearance/no adnexal mass. Left ovary Measurements: 3.1 x 2 x 1.4 cm.  Normal appearance/no adnexal mass. Pulsed Doppler evaluation of both ovaries demonstrates normal low-resistance arterial and venous waveforms. Other findings No abnormal free fluid. IMPRESSION: No intrauterine or ectopic pregnancy identified. No retained products of conception. No adnexal mass or ovarian torsion. Electronically Signed   By: Tollie Eth M.D.   On: 05/22/2018 23:24   US Ob Transvaginal  Result Date: 05/22/2018 CLINICAL DATA:  Recent abortion. Beta HCG 205. Pelvic pain and fever x3 days. EXAM: TRANSABDOMINAL AND TRANSVAGINAL ULTRASOUND OF PELVIS DOPPLER ULTRASOUND OF OVARIES TECHNIQUE: Both transabdominal and transvaginal ultrasound examinations of the pelvis were performed. Transabdominal technique was performed for global imaging of the pelvis including uterus, ovaries, adnexal regions, and pelvic cul-de-sac.  It was necessary to proceed with endovaginal exam following the transabdominal exam to visualize the ovaries. Color and duplex Doppler ultrasound was utilized to evaluate blood flow to the ovaries. COMPARISON:  None. FINDINGS: Uterus Measurements: Anteverted uterus without focal mass. No intrauterine or ectopic pregnancy is identified. The uterus measures 8.7 x 3.8 x 4 cm. Endometrium Thickness: 11.7 mm in thickness. No retained products of conception identified. Right ovary Measurements: 3 x 2.1 x 2.3 cm.  Normal appearance/no adnexal mass. Left ovary Measurements: 3.1 x 2 x 1.4 cm.  Normal appearance/no adnexal mass. Pulsed Doppler evaluation of both ovaries demonstrates normal low-resistance arterial and venous waveforms. Other findings No abnormal free fluid. IMPRESSION: No intrauterine or ectopic pregnancy identified. No retained products of conception. No adnexal mass or ovarian torsion. Electronically Signed   By: Tollie Eth M.D.   On: 05/22/2018 23:24   Ct Abdomen Pelvis W Contrast  Result Date: 05/23/2018 CLINICAL DATA:  27 year old female with abdominal pain. EXAM: CT ABDOMEN AND PELVIS WITH CONTRAST TECHNIQUE: Multidetector CT imaging of the abdomen and pelvis was performed using the standard protocol following bolus administration of intravenous contrast. CONTRAST:  ISOVUE-300 IOPAMIDOL (ISOVUE-300) INJECTION 61% COMPARISON:  Pelvic ultrasound dated 05/22/2018 FINDINGS: Lower chest: The visualized lung bases are clear. No intra-abdominal free air or free fluid. Hepatobiliary: Subcentimeter right hepatic hypodense lesion is too small to characterize. The liver is otherwise unremarkable. No intrahepatic biliary ductal dilatation. No calcified gallstone identified. There is trace pericholecystic fluid. Further evaluation with right upper quadrant ultrasound recommended. Pancreas: Unremarkable. No pancreatic ductal dilatation or surrounding inflammatory changes. Spleen: Normal in size without  focal abnormality. Adrenals/Urinary Tract: Adrenal glands are unremarkable. Kidneys are normal, without renal calculi, focal lesion, or hydronephrosis. Bladder is unremarkable. Stomach/Bowel: There is mild hazy appearance of the wall of the distal stomach with mild stranding of the surrounding fat. No definite gastric ulcer identified by CT. Mild gastritis is not entirely excluded. Clinical correlation is recommended. There is no bowel obstruction. The appendix is normal. Vascular/Lymphatic: No significant vascular findings are present. No enlarged abdominal or pelvic lymph nodes. Reproductive: The uterus is anteverted and grossly unremarkable. The ovaries appear unremarkable as well. Other: Small fat containing umbilical hernia. Musculoskeletal: No acute osseous pathology. IMPRESSION: 1. Trace pericholecystic fluid. Further evaluation with ultrasound recommended. 2. Mild haziness of the distal stomach may represent gastritis. Clinical correlation is recommended. No bowel obstruction. Normal appendix. Electronically Signed   By: Elgie Collard M.D.   On: 05/23/2018 00:43   [place] Art/ven Flow Abd Pelv Doppler  Result Date: 05/22/2018 CLINICAL DATA:  Recent abortion. Beta HCG 205. Pelvic pain and fever  x3 days. EXAM: TRANSABDOMINAL AND TRANSVAGINAL ULTRASOUND OF PELVIS DOPPLER ULTRASOUND OF OVARIES TECHNIQUE: Both transabdominal and transvaginal ultrasound examinations of the pelvis were performed. Transabdominal technique was performed for global imaging of the pelvis including uterus, ovaries, adnexal regions, and pelvic cul-de-sac. It was necessary to proceed with endovaginal exam following the transabdominal exam to visualize the ovaries. Color and duplex Doppler ultrasound was utilized to evaluate blood flow to the ovaries. COMPARISON:  None. FINDINGS: Uterus Measurements: Anteverted uterus without focal mass. No intrauterine or ectopic pregnancy is identified. The uterus measures 8.7 x 3.8 x 4 cm. Endometrium  Thickness: 11.7 mm in thickness. No retained products of conception identified. Right ovary Measurements: 3 x 2.1 x 2.3 cm.  Normal appearance/no adnexal mass. Left ovary Measurements: 3.1 x 2 x 1.4 cm.  Normal appearance/no adnexal mass. Pulsed Doppler evaluation of both ovaries demonstrates normal low-resistance arterial and venous waveforms. Other findings No abnormal free fluid. IMPRESSION: No intrauterine or ectopic pregnancy identified. No retained products of conception. No adnexal mass or ovarian torsion. Electronically Signed   By: Tollie Eth M.D.   On: 05/22/2018 23:24   US Abdomen Limited Ruq  Result Date: 05/23/2018 CLINICAL DATA:  27 year old female with abdominal pain. EXAM: ULTRASOUND ABDOMEN LIMITED RIGHT UPPER QUADRANT COMPARISON:  Abdominal CT dated 05/23/2018 FINDINGS: Gallbladder: The gallbladder is partially contracted. No gallstone. No significant pericholecystic fluid. Common bile duct: Diameter: 3 mm Liver: No focal lesion identified. Within normal limits in parenchymal echogenicity. Portal vein is patent on color Doppler imaging with normal direction of blood flow towards the liver. IMPRESSION: Unremarkable right upper quadrant ultrasound. Electronically Signed   By: Elgie Collard M.D.   On: 05/23/2018 02:38    Procedures Procedures (including critical care time)  Medications Ordered in ED Medications  piperacillin-tazobactam (ZOSYN) IVPB 3.375 g ( Intravenous Restarted 05/23/18 0031)  iopamidol (ISOVUE-300) 61 % injection (has no administration in time range)  ketorolac (TORADOL) 15 MG/ML injection 15 mg (has no administration in time range)  sodium chloride 0.9 % bolus 1,000 mL (0 mLs Intravenous Stopped 05/22/18 2315)  ondansetron (ZOFRAN) injection 4 mg (4 mg Intravenous Given 05/22/18 2211)  morphine 4 MG/ML injection 4 mg (4 mg Intravenous Given 05/22/18 2211)  acetaminophen (TYLENOL) tablet 1,000 mg (1,000 mg Oral Given 05/22/18 2211)  iopamidol (ISOVUE-300) 61 %  injection 100 mL (100 mLs Intravenous Contrast Given 05/23/18 0018)  cefTRIAXone (ROCEPHIN) 1 g in sodium chloride 0.9 % 100 mL IVPB (1 g Intravenous New Bag/Given 05/23/18 0142)  doxycycline (VIBRA-TABS) tablet 100 mg (100 mg Oral Given 05/23/18 0142)  metroNIDAZOLE (FLAGYL) tablet 500 mg (500 mg Oral Given 05/23/18 0142)     Initial Impression / Assessment and Plan / ED Course  I have reviewed the triage vital signs and the nursing notes.  Pertinent labs & imaging results that were available during my care of the patient were reviewed by me and considered in my medical decision making (see chart for details).     Patient presents to the emergency department today for evaluation of lower abdominal pain with associated nausea, vomiting and fevers.  Patient did have a recent medical abortion 2 weeks ago.  She is sexually active but has no concern for STD.  Patient is with low-grade fever on my examination.  She is mildly tachycardic.  She is not hypotensive.  On exam patient does have some pain to palpation of the lower abdomen with normal bowel sounds.  No signs of peritonitis.  Lungs clear to auscultation.  Heart regular rate and rhythm.  Pelvic exam reveals vaginal discharge with minimal blood noted.  She has bilateral adnexal tenderness but no significant cervical motion tenderness.  Lab work consistent with a leukocytosis of 15,000.  Hemoglobin at patient's baseline.  No significant elect light derangement.  Normal liver enzymes.  Normal kidney function.  UA shows large amounts of RBCs consistent with menstrual cycle, WBCs and rare bacteria.  Is nitrite negative.  Patient denies urinary symptoms.  I will not treat for UTI at this time.  UA seems more consistent with possible STD.  Wet prep shows many WBCs and trichomonas but no other acute findings.  Lactic acid was normal.  Normal lipase.  Beta hCG is 205.8.  Patient is O+.  Gonorrhea and Chlamydia cultures are pending.  Given patient's recent  medical abortion concern for possible retained products of conception versus endometria-itis.  Vaginal ultrasound was performed.  This showed no acute findings.  I do suspect that patient's symptoms are likely secondary to PID given that she is positive for trichomonas.  Patient will be treated prophylactically for PID including gonorrhea and chlamydia coverage along with Flagyl.  Given patient's fever and lower belly pain CT was performed of abdomen to rule out appendicitis.  This was unremarkable except for some pericholecystic fluid around the gallbladder.  A recommends ultrasound of right upper quadrant.  Patient has no pain at this area.  Ultrasound was performed that showed no acute findings.  Specifically there was no signs of ectopic pregnancy or ovarian torsion.  Patient's beta hCG is positive however recent abortion 2 weeks ago.  No signs of ectopic or intrauterine pregnancy on ultrasound.  This will need to be followed up with OB/GYN.  Patient was given a 14-day course of antibiotics.  Encouraged Motrin and Tylenol at home for fever and pain.  Patient has been encouraged to see OB/GYN in the next week.  I discussed this case with Dr. Despina HiddenEure who is agreeable with this plan.  Patient does not need RhoGam given that she is O+.  Pt is hemodynamically stable, in NAD, & able to ambulate in the ED. Evaluation does not show pathology that would require ongoing emergent intervention or inpatient treatment. I explained the diagnosis to the patient. Pain has been managed & has no complaints prior to dc. Pt is comfortable with above plan and is stable for discharge at this time. All questions were answered prior to disposition. Strict return precautions for f/u to the ED were discussed. Encouraged follow up with PCP.   Final Clinical Impressions(s) / ED Diagnoses   Final diagnoses:  Abdominal pain  PID (acute pelvic inflammatory disease)    ED Discharge Orders        Ordered    doxycycline  (VIBRAMYCIN) 100 MG capsule  2 times daily     05/23/18 0243    metroNIDAZOLE (FLAGYL) 500 MG tablet  2 times daily with meals     05/23/18 0243       Rise MuLeaphart, Amalio Loe T, PA-C 05/23/18 0251    Rise MuLeaphart, Jarely Juncaj T, PA-C 05/23/18 0251    Gerhard MunchLockwood, Robert, MD 05/25/18 1625

## 2018-05-23 ENCOUNTER — Encounter (HOSPITAL_COMMUNITY): Payer: Self-pay | Admitting: Radiology

## 2018-05-23 ENCOUNTER — Emergency Department (HOSPITAL_COMMUNITY): Payer: BLUE CROSS/BLUE SHIELD

## 2018-05-23 DIAGNOSIS — R109 Unspecified abdominal pain: Secondary | ICD-10-CM | POA: Diagnosis not present

## 2018-05-23 LAB — ABO/RH: ABO/RH(D): O POS

## 2018-05-23 LAB — GC/CHLAMYDIA PROBE AMP (~~LOC~~) NOT AT ARMC
Chlamydia: POSITIVE — AB
Neisseria Gonorrhea: POSITIVE — AB

## 2018-05-23 MED ORDER — METRONIDAZOLE 500 MG PO TABS
500.0000 mg | ORAL_TABLET | Freq: Once | ORAL | Status: AC
  Administered 2018-05-23: 500 mg via ORAL
  Filled 2018-05-23: qty 1

## 2018-05-23 MED ORDER — DOXYCYCLINE HYCLATE 100 MG PO TABS
100.0000 mg | ORAL_TABLET | Freq: Once | ORAL | Status: AC
  Administered 2018-05-23: 100 mg via ORAL
  Filled 2018-05-23: qty 1

## 2018-05-23 MED ORDER — DOXYCYCLINE HYCLATE 100 MG PO CAPS: 100.0000 mg | ORAL_CAPSULE | Freq: Two times a day (BID) | ORAL | 0 refills | Status: AC

## 2018-05-23 MED ORDER — SODIUM CHLORIDE 0.9 % IV SOLN
1.0000 g | Freq: Once | INTRAVENOUS | Status: AC
  Administered 2018-05-23: 1 g via INTRAVENOUS
  Filled 2018-05-23: qty 10

## 2018-05-23 MED ORDER — IOPAMIDOL (ISOVUE-300) INJECTION 61%
INTRAVENOUS | Status: AC
  Filled 2018-05-23: qty 100

## 2018-05-23 MED ORDER — IOPAMIDOL (ISOVUE-300) INJECTION 61%
100.0000 mL | Freq: Once | INTRAVENOUS | Status: AC | PRN
  Administered 2018-05-23: 100 mL via INTRAVENOUS

## 2018-05-23 MED ORDER — KETOROLAC TROMETHAMINE 15 MG/ML IJ SOLN
15.0000 mg | Freq: Once | INTRAMUSCULAR | Status: AC
  Administered 2018-05-23: 15 mg via INTRAVENOUS
  Filled 2018-05-23: qty 1

## 2018-05-23 MED ORDER — METRONIDAZOLE 500 MG PO TABS: 500.0000 mg | ORAL_TABLET | Freq: Two times a day (BID) | ORAL | 0 refills | Status: AC

## 2018-05-23 NOTE — ED Notes (Signed)
Patient transported to CT 

## 2018-05-23 NOTE — Discharge Instructions (Addendum)
Your work-up seems consistent with pelvic inflammatory disease.  This is possibly caused by the recent procedure or BIOflex which transmitted disease.  Your gonorrhea and Chlamydia cultures are pending.  Take antibiotics as prescribed.  Motrin and Tylenol for pain.  I would recommend following up with the women's clinic this week.  Please call to schedule an appointment.  Return to the ED if your fevers persist, worsening pain or for any other reason.

## 2018-05-24 ENCOUNTER — Telehealth: Payer: Self-pay | Admitting: Medical

## 2018-05-24 DIAGNOSIS — A749 Chlamydial infection, unspecified: Secondary | ICD-10-CM

## 2018-05-24 MED ORDER — AZITHROMYCIN 250 MG PO TABS: 1000.0000 mg | ORAL_TABLET | Freq: Once | ORAL | 0 refills | Status: AC

## 2018-05-24 NOTE — Telephone Encounter (Addendum)
Gabriela [name] tested positive for  Chlamydia. Patient was called by Gabriela [name] and allergies and pharmacy confirmed. Patient already received Rocephin and Doxycycline. Rx for Zithromax sent to pharmacy of choice.   Gabriela [name], Gabriela [name], Gabriela [name] 05/24/2018 2:40 PM      ----- Message from Gabriela BectonLori [name] Berdik, Gabriela [name] sent at 05/24/2018  2:13 PM EDT ----- Regarding: Mayra NeerSTd Hi Gabriela, She was treated for gonorrhea and also received Doxycycline for treatment.  She did not receive Azithromycin and I know that the CDC is requiring the pt.[name] to receive Azithromycin. I apologize I should have explained better.  Thank you, Gabriela [name] ----- Message ----- From: Kathlene CoteWenzel, Gabriela [name], Gabriela [name] Sent: 05/24/2018   2:02 PM To: Gabriela BectonLori [name] Berdik, Gabriela [name]  Gabriela [name],   Since patient has +Gonorrhea and +Chlamydia [name]. Chlamydia treatment is included in Gonorrhea treatment so it can all be done at the same time.   Thanks,  Gabriela [name]   ----- Message ----- From: Gabriela BectonBerdik, Gabriela [name], Gabriela [name] Sent: 05/24/2018   1:13 PM To: Gabriela [name] Providers  This patient tested positive for ("Chlamydia","Gonorrhea")  She "has allergy to Shellfish" ,  I have informed the patient of her results and confirmed her pharmacy is correct in her chart. Please send Rx.   Thank you,   Gabriela BectonBerdik, Gabriela [name], Gabriela [name]   [name]

## 2018-07-22 DIAGNOSIS — Z111 Encounter for screening for respiratory tuberculosis: Secondary | ICD-10-CM

## 2018-07-22 LAB — TB SKIN TEST
Induration: 0 mm
TB SKIN TEST: NEGATIVE

## 2018-07-22 NOTE — Congregational Nurse Program (Signed)
TB skin test for employment.  Test done on 07/20/18.  Right forearm.  Read on 07/22/18 - Results negative

## 2018-08-26 DIAGNOSIS — Z113 Encounter for screening for infections with a predominantly sexual mode of transmission: Secondary | ICD-10-CM | POA: Diagnosis not present

## 2018-08-26 DIAGNOSIS — Z114 Encounter for screening for human immunodeficiency virus [HIV]: Secondary | ICD-10-CM | POA: Diagnosis not present

## 2018-08-26 DIAGNOSIS — Z6823 Body mass index (BMI) 23.0-23.9, adult: Secondary | ICD-10-CM | POA: Diagnosis not present

## 2018-08-26 DIAGNOSIS — Z1159 Encounter for screening for other viral diseases: Secondary | ICD-10-CM | POA: Diagnosis not present

## 2018-08-26 DIAGNOSIS — Z3009 Encounter for other general counseling and advice on contraception: Secondary | ICD-10-CM | POA: Diagnosis not present

## 2018-08-26 DIAGNOSIS — Z01419 Encounter for gynecological examination (general) (routine) without abnormal findings: Secondary | ICD-10-CM | POA: Diagnosis not present

## 2018-11-08 DIAGNOSIS — Z1159 Encounter for screening for other viral diseases: Secondary | ICD-10-CM | POA: Diagnosis not present

## 2018-11-08 DIAGNOSIS — Z118 Encounter for screening for other infectious and parasitic diseases: Secondary | ICD-10-CM | POA: Diagnosis not present

## 2018-11-08 DIAGNOSIS — Z202 Contact with and (suspected) exposure to infections with a predominantly sexual mode of transmission: Secondary | ICD-10-CM | POA: Diagnosis not present

## 2018-11-08 DIAGNOSIS — Z113 Encounter for screening for infections with a predominantly sexual mode of transmission: Secondary | ICD-10-CM | POA: Diagnosis not present

## 2018-11-08 DIAGNOSIS — Z114 Encounter for screening for human immunodeficiency virus [HIV]: Secondary | ICD-10-CM | POA: Diagnosis not present

## 2018-11-17 IMAGING — US US ABDOMEN LIMITED
1 series · 14 of 25 positions shown · non-contrast
Comparison: Abdominal CT dated 05/23/2018

CLINICAL DATA: 26-year-old female with abdominal pain.

EXAM:
ULTRASOUND ABDOMEN LIMITED RIGHT UPPER QUADRANT

[Series 1: us abdomen limited · 14 of 63 slices shown]
[im 1/63]
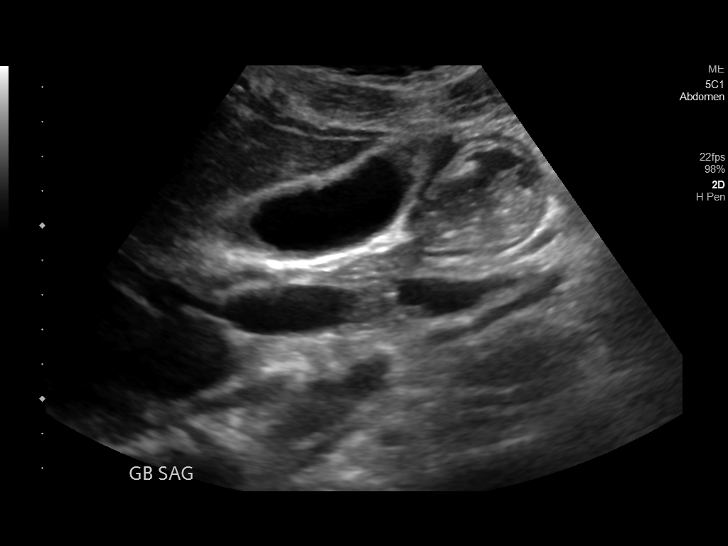
[im 6/63]
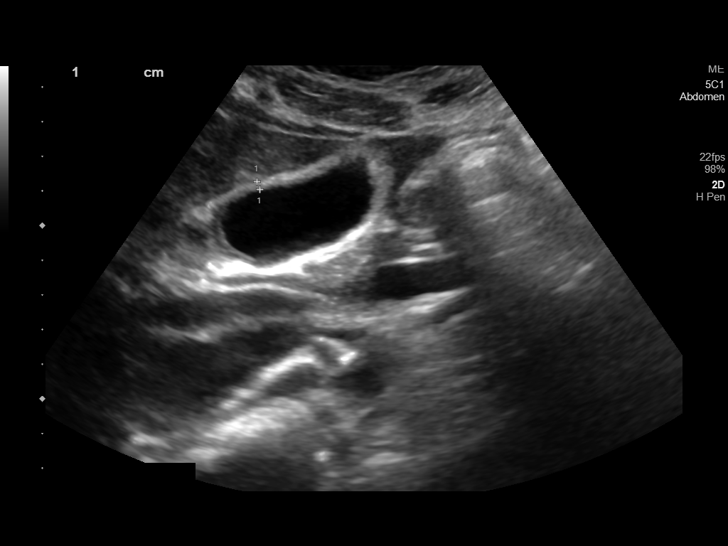
[im 11/63]
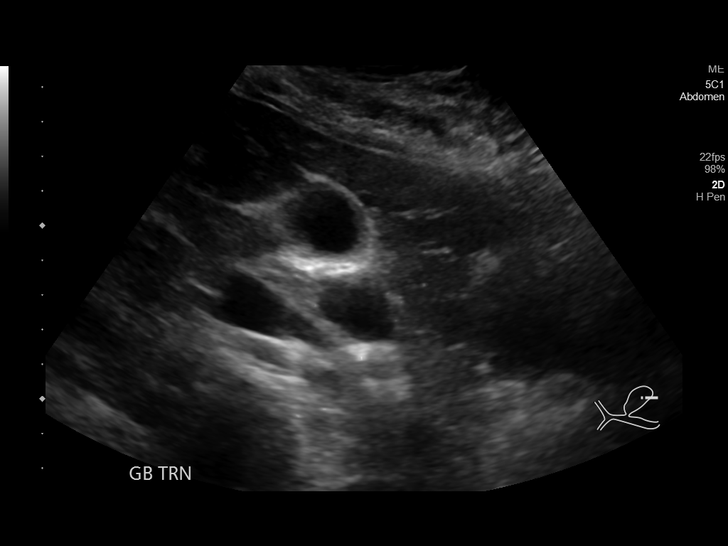
[im 16/63]
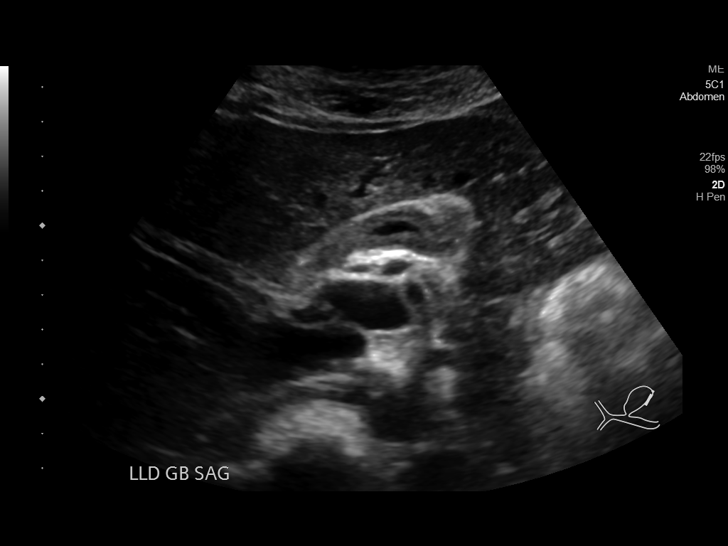
[im 21/63]
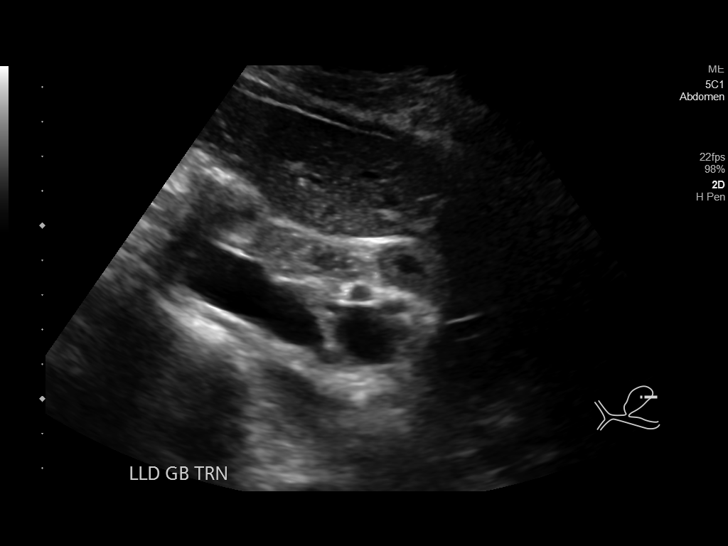
[im 24/63]
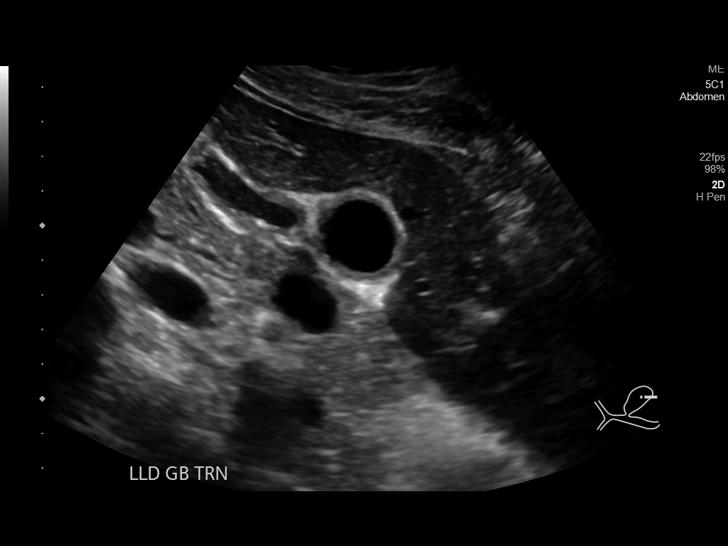
[im 29/63]
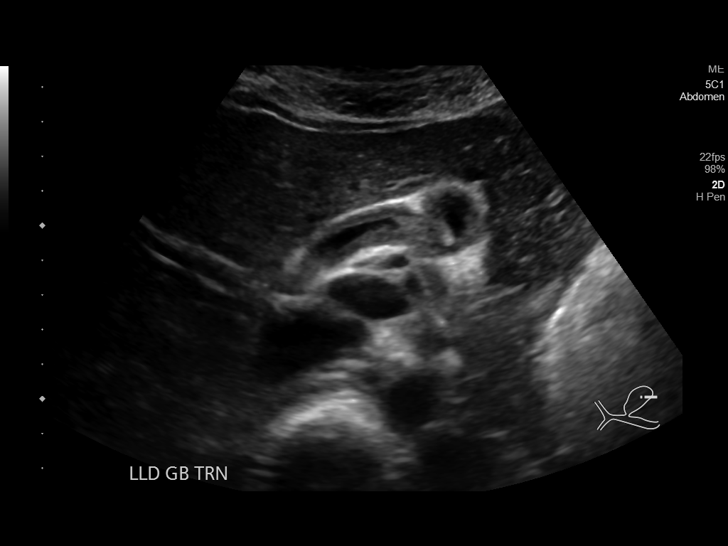
[im 34/63]
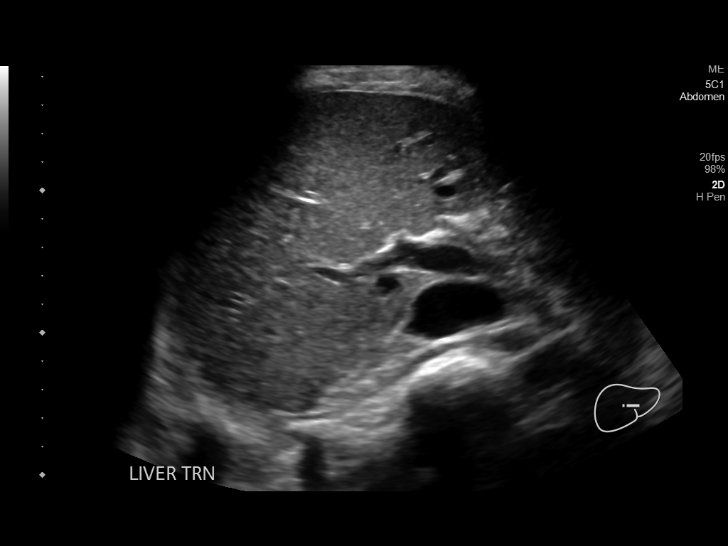
[im 39/63]
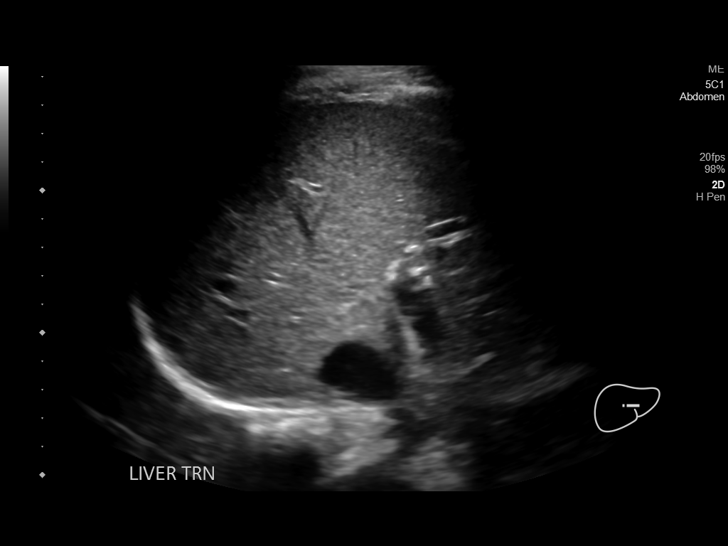
[im 42/63]
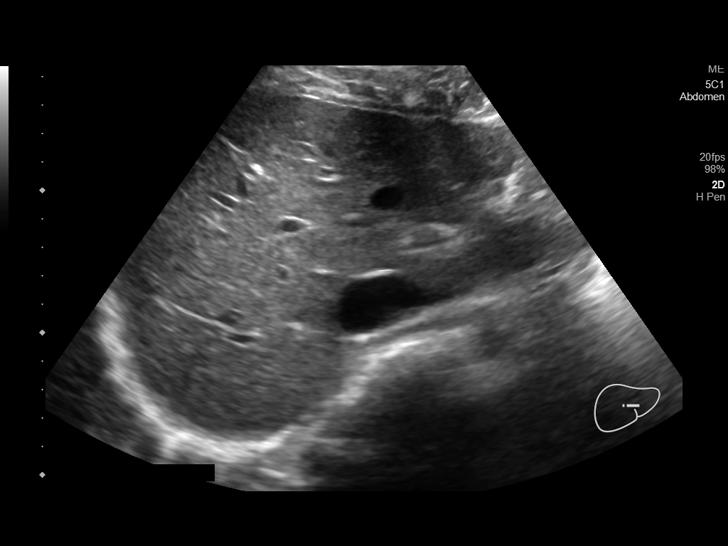
[im 47/63]
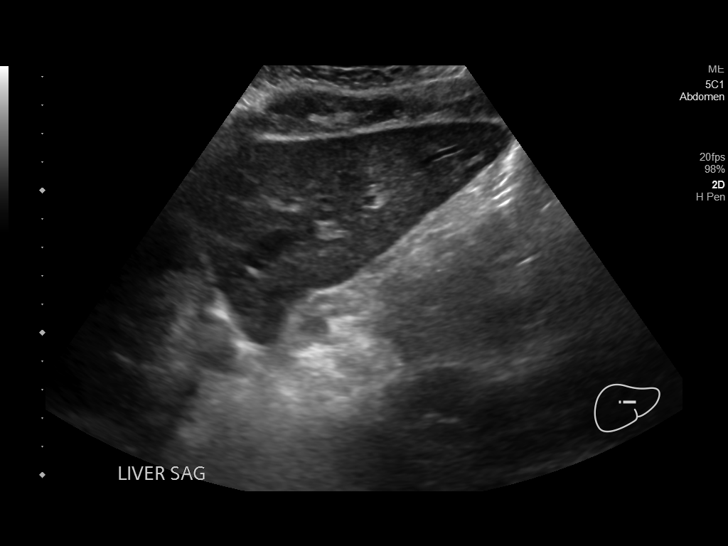
[im 52/63]
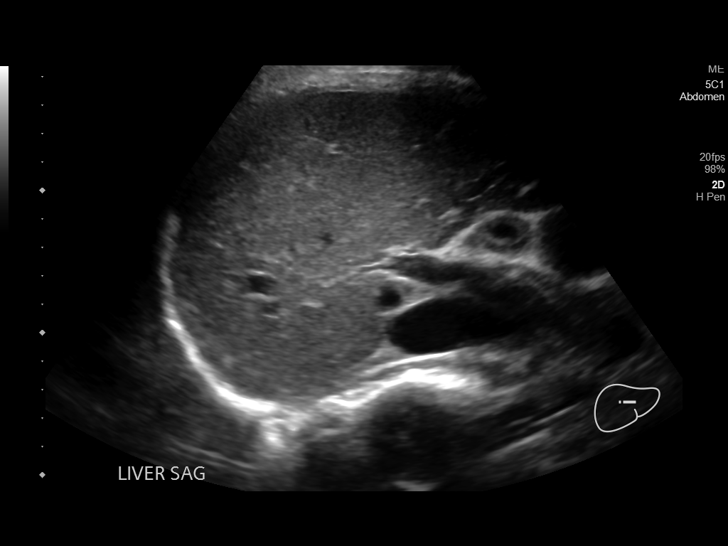
[im 57/63]
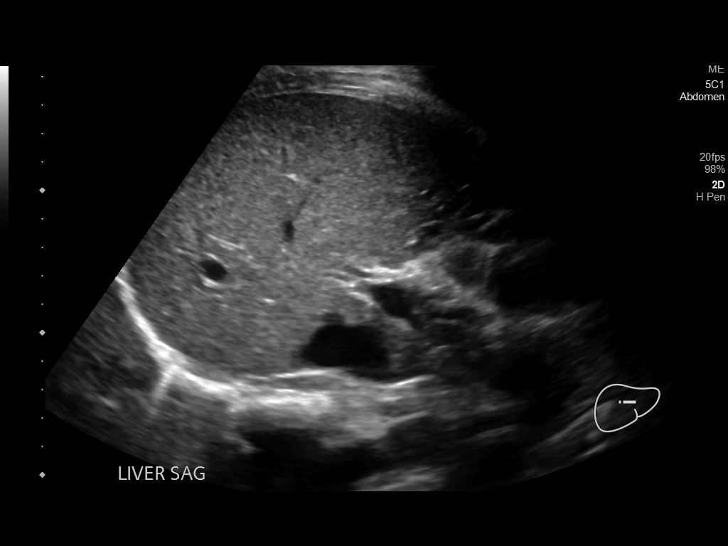
[im 63/63]
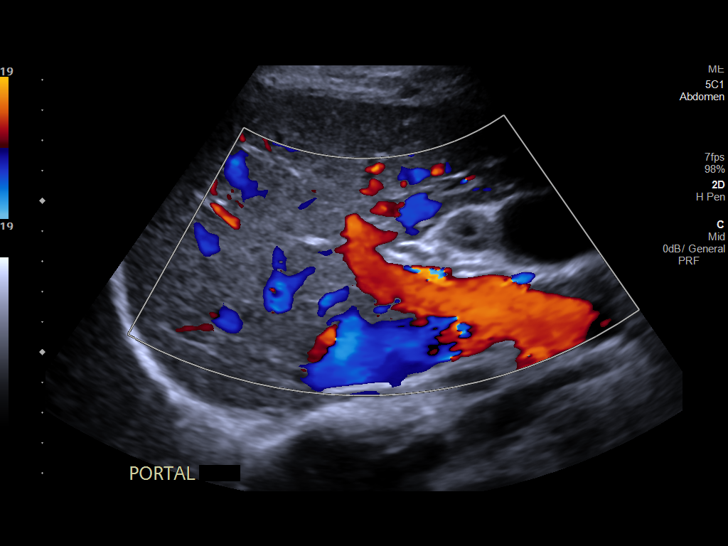

[14 of 25 positions shown; findings below may reference images not displayed]

FINDINGS: Gallbladder:

The gallbladder is partially contracted. No gallstone. No
significant pericholecystic fluid.

Common bile duct:

Diameter: 3 mm

Liver:

No focal lesion identified. Within normal limits in parenchymal
echogenicity. Portal vein is patent on color Doppler imaging with
normal direction of blood flow towards the liver.
IMPRESSION: Unremarkable right upper quadrant ultrasound.

## 2018-11-17 IMAGING — CT CT ABD-PELV W/ CM
2 of 5 series · 16 of 46 positions shown, 18 images · IV contrast (ISOVUE)
Comparison: Pelvic ultrasound dated 05/22/2018

CLINICAL DATA: 26-year-old female with abdominal pain.

EXAM:
CT ABDOMEN AND PELVIS WITH CONTRAST
TECHNIQUE: Multidetector CT imaging of the abdomen and pelvis was performed
using the standard protocol following bolus administration of
intravenous contrast.
CONTRAST:  100mL HKD75F-ULL IOPAMIDOL (HKD75F-ULL) INJECTION 61%

[Series 2: axial st · axial · 0.63mm/px · z∈[-452,-102]mm · 13 of 81 slices shown, 15 images]
[im 6/81  soft-tissue]
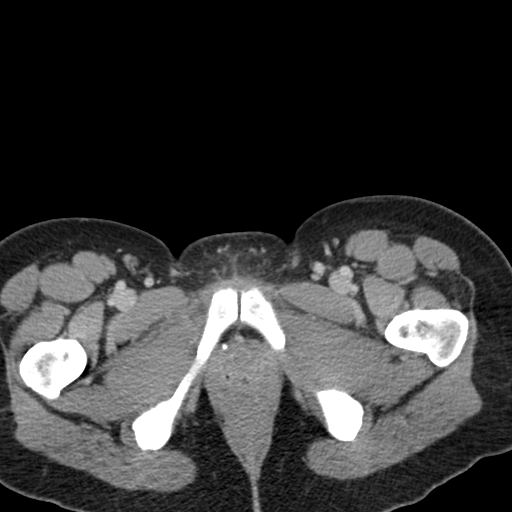
[im 6/81  bone]
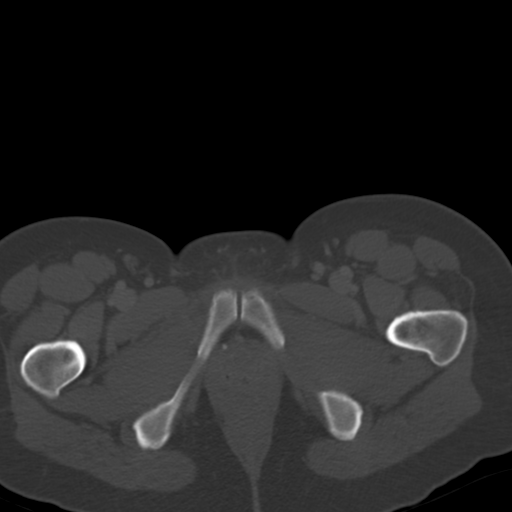
[im 11/81  soft-tissue]
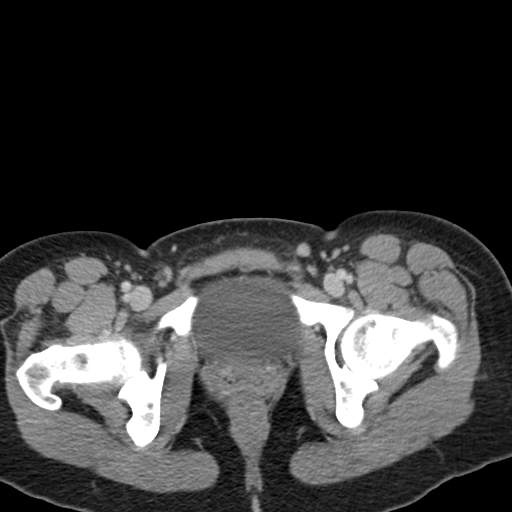
[im 16/81  soft-tissue]
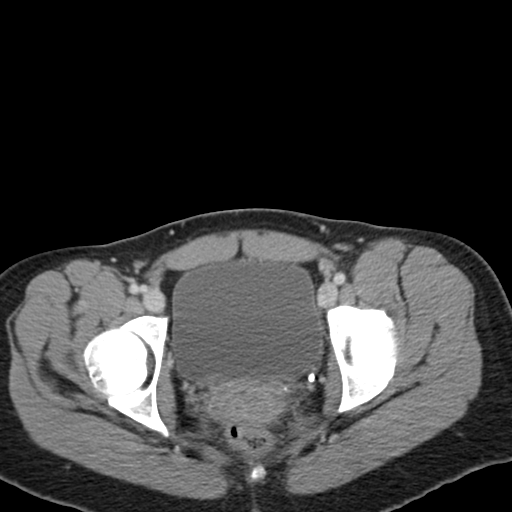
[im 26/81  soft-tissue]
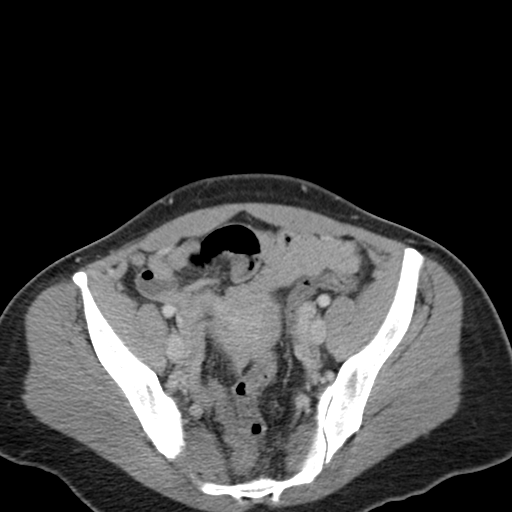
[im 31/81  soft-tissue]
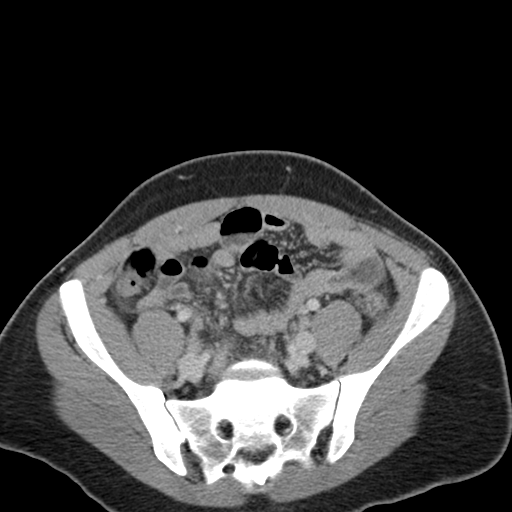
[im 36/81  soft-tissue]
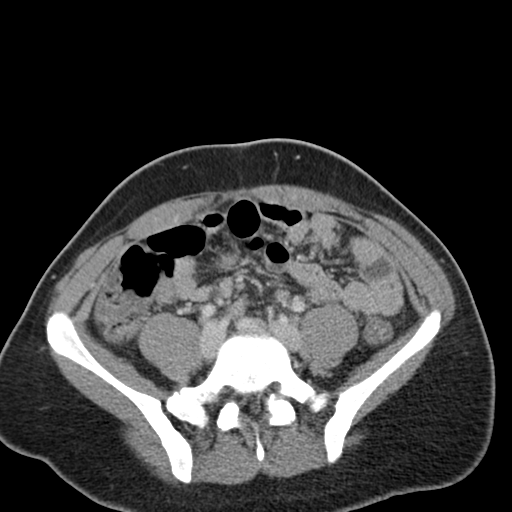
[im 41/81  soft-tissue]
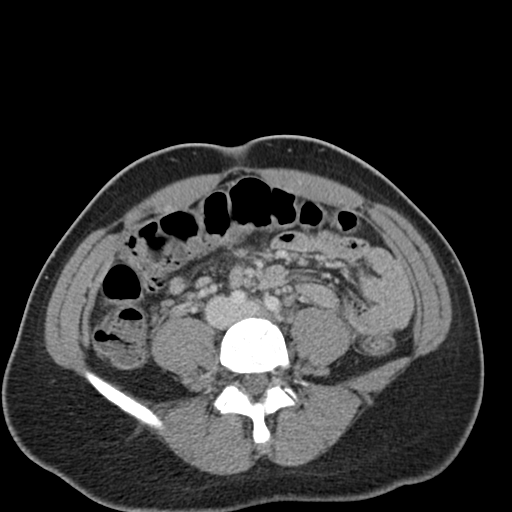
[im 46/81  soft-tissue]
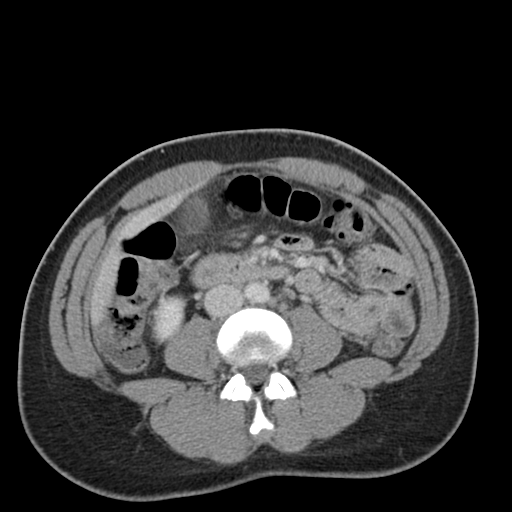
[im 51/81  soft-tissue]
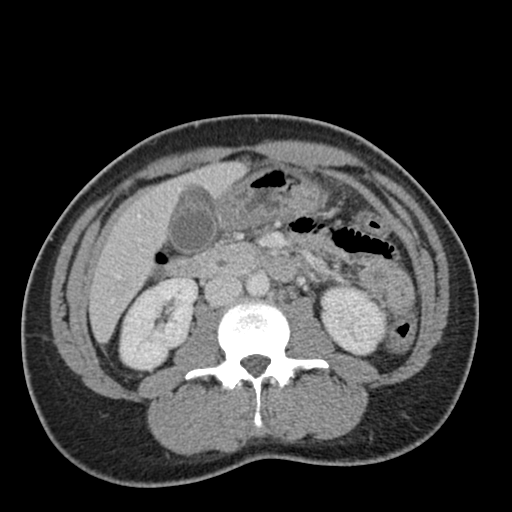
[im 51/81  bone]
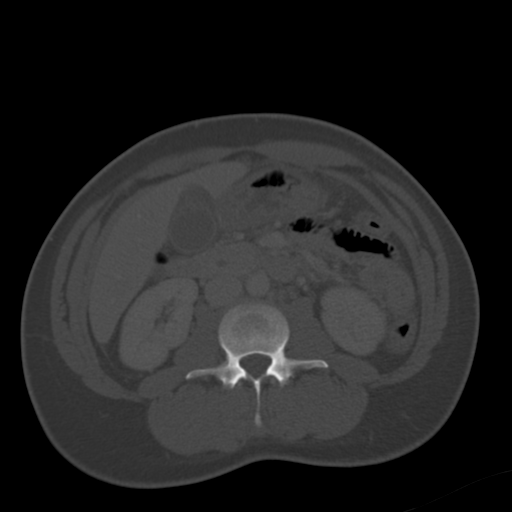
[im 56/81  soft-tissue]
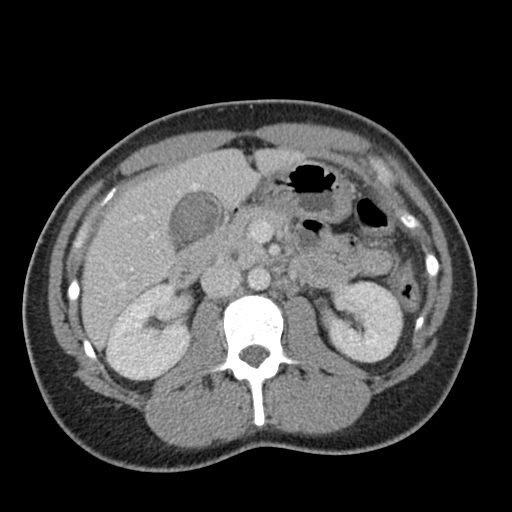
[im 66/81  soft-tissue]
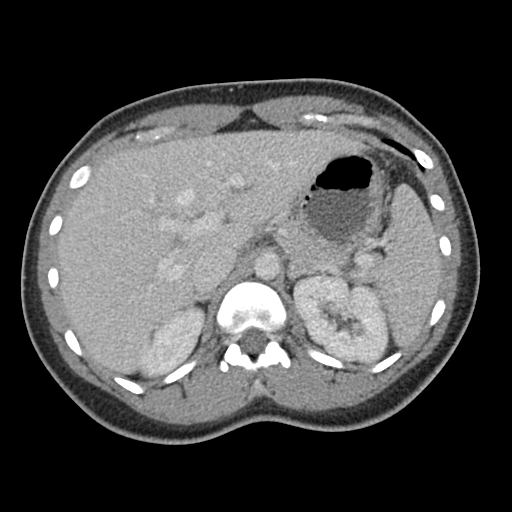
[im 71/81  soft-tissue]
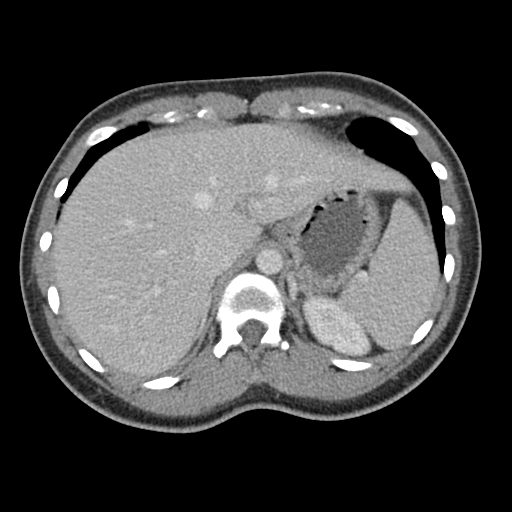
[im 76/81  soft-tissue]
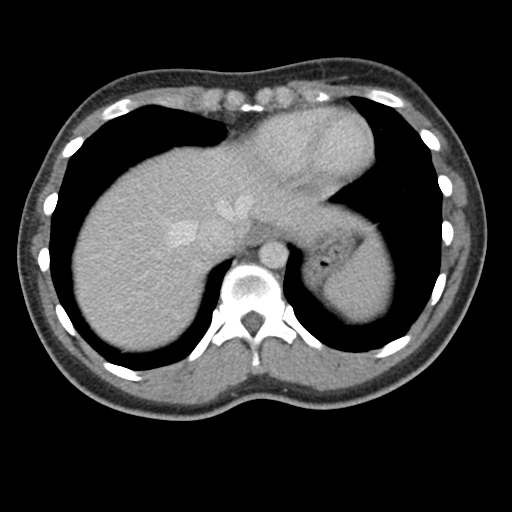

[Series 5: coronal st · coronal · 0.76mm/px · 3 of 93 slices shown]
[im 31/93  soft-tissue]
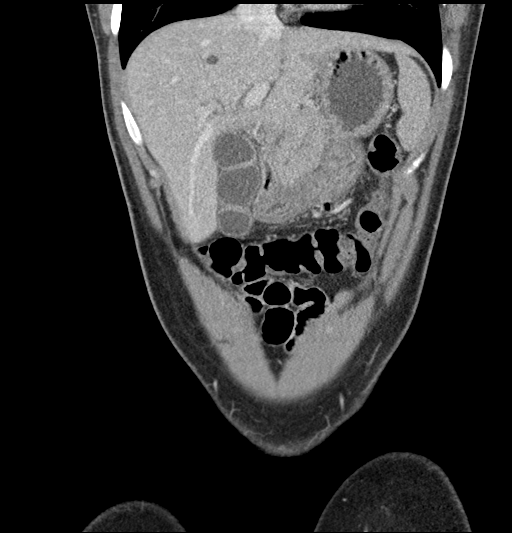
[im 41/93  soft-tissue]
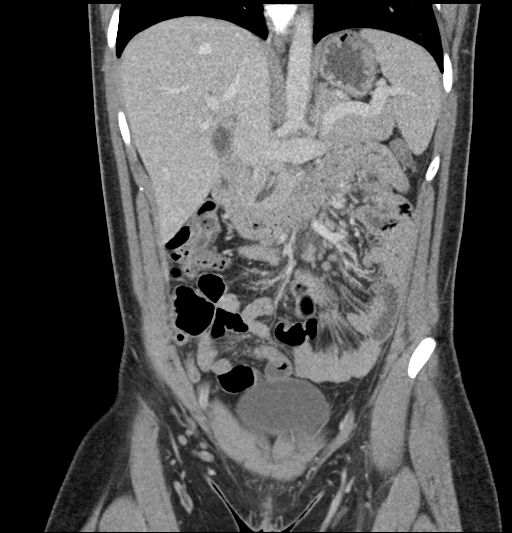
[im 52/93  soft-tissue]
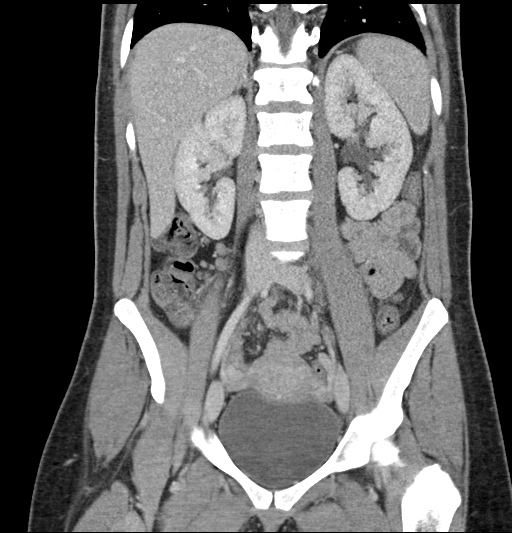

[16 of 46 positions shown; findings below may reference images not displayed]

FINDINGS: Lower chest: The visualized lung bases are clear.

No intra-abdominal free air or free fluid.

Hepatobiliary: Subcentimeter right hepatic hypodense lesion is too
small to characterize. The liver is otherwise unremarkable. No
intrahepatic biliary ductal dilatation. No calcified gallstone
identified. There is trace pericholecystic fluid. Further evaluation
with right upper quadrant ultrasound recommended.

Pancreas: Unremarkable. No pancreatic ductal dilatation or
surrounding inflammatory changes.

Spleen: Normal in size without focal abnormality.

Adrenals/Urinary Tract: Adrenal glands are unremarkable. Kidneys are
normal, without renal calculi, focal lesion, or hydronephrosis.
Bladder is unremarkable.

Stomach/Bowel: There is mild hazy appearance of the wall of the
distal stomach with mild stranding of the surrounding fat. No
definite gastric ulcer identified by CT. Mild gastritis is not
entirely excluded. Clinical correlation is recommended. There is no
bowel obstruction. The appendix is normal.

Vascular/Lymphatic: No significant vascular findings are present. No
enlarged abdominal or pelvic lymph nodes.

Reproductive: The uterus is anteverted and grossly unremarkable. The
ovaries appear unremarkable as well.

Other: Small fat containing umbilical hernia.

Musculoskeletal: No acute osseous pathology.
IMPRESSION: 1. Trace pericholecystic fluid. Further evaluation with ultrasound
recommended.
2. Mild haziness of the distal stomach may represent gastritis.
Clinical correlation is recommended. No bowel obstruction. Normal
appendix.

## 2018-12-07 DIAGNOSIS — Z118 Encounter for screening for other infectious and parasitic diseases: Secondary | ICD-10-CM | POA: Diagnosis not present

## 2018-12-07 DIAGNOSIS — A749 Chlamydial infection, unspecified: Secondary | ICD-10-CM | POA: Diagnosis not present

## 2018-12-07 DIAGNOSIS — Z3041 Encounter for surveillance of contraceptive pills: Secondary | ICD-10-CM | POA: Diagnosis not present

## 2019-03-16 ENCOUNTER — Encounter (HOSPITAL_COMMUNITY): Payer: Self-pay

## 2019-03-16 ENCOUNTER — Other Ambulatory Visit: Payer: Self-pay

## 2019-03-16 ENCOUNTER — Emergency Department (HOSPITAL_COMMUNITY)
Admission: EM | Admit: 2019-03-16 | Discharge: 2019-03-16 | Disposition: A | Discharge status: Home / Self Care | Payer: BLUE CROSS/BLUE SHIELD | Attending: Emergency Medicine | Admitting: Emergency Medicine

## 2019-03-16 DIAGNOSIS — R1030 Lower abdominal pain, unspecified: Secondary | ICD-10-CM | POA: Diagnosis not present

## 2019-03-16 DIAGNOSIS — F17200 Nicotine dependence, unspecified, uncomplicated: Secondary | ICD-10-CM | POA: Insufficient documentation

## 2019-03-16 DIAGNOSIS — Z202 Contact with and (suspected) exposure to infections with a predominantly sexual mode of transmission: Secondary | ICD-10-CM | POA: Insufficient documentation

## 2019-03-16 DIAGNOSIS — Z20828 Contact with and (suspected) exposure to other viral communicable diseases: Secondary | ICD-10-CM | POA: Diagnosis not present

## 2019-03-16 MED ORDER — CEFTRIAXONE SODIUM 250 MG IJ SOLR
250.0000 mg | Freq: Once | INTRAMUSCULAR | Status: AC
  Administered 2019-03-16: 250 mg via INTRAMUSCULAR
  Filled 2019-03-16: qty 250

## 2019-03-16 MED ORDER — AZITHROMYCIN 1 G PO PACK
1.0000 g | PACK | Freq: Once | ORAL | Status: AC
Start: 2019-03-16 — End: 2019-03-16
  Administered 2019-03-16: 1 g via ORAL
  Filled 2019-03-16: qty 1

## 2019-03-16 MED ORDER — DOXYCYCLINE HYCLATE 100 MG PO CAPS: 100.0000 mg | ORAL_CAPSULE | Freq: Two times a day (BID) | ORAL | 0 refills | Status: AC

## 2019-03-16 MED ORDER — LIDOCAINE HCL 1 % IJ SOLN
INTRAMUSCULAR | Status: AC
  Filled 2019-03-16: qty 20

## 2019-03-16 NOTE — ED Triage Notes (Signed)
Pt c/o of her abdomen being tender to touch the past few days.  Pt's partner recently told pt he has chlamydia.  Pt states she has not had any vaginal discharge.

## 2019-03-16 NOTE — Discharge Instructions (Signed)
Please return for any problem.  Follow-up with your regular care provider as instructed. °

## 2019-03-16 NOTE — ED Provider Notes (Signed)
[facility]-EMERGENCY DEPT Provider Note   CSN: [id number] Arrival date & time: 03/16/19  0740    History   Chief Complaint Chief Complaint  Patient presents with  . STD check  . lower abdominal pain    HPI Gabriela [name] is a 28 y.o. female.     28 year old female with prior medical history as detailed below presents for evaluation of possible chlamydia.  Patient reports that her current partner told her yesterday that he was diagnosed and treated for chlamydia approximately 3 weeks ago.  He had not yet informed the patient.  Patient denies fever, significant abdominal pain, vaginal discharge.  She does desire treatment for possible chlamydia.  She does report a prior history of STD.  She was treated previously for chlamydia obtained after intercourse with the same partner who apparently gave her chlamydia this time.  She does have routine GYN care.  Her last Pap smear and GYN exam was in January.  She denies other complaint.  She is on birth control.  She is currently on her period.  She is certain that she is not pregnant.  Patient desires treatment for chlamydia.  She declines pelvic exam.   The history is provided by the patient and medical records.  Illness  Location:  STD check Severity:  Mild Onset quality:  Sudden Duration:  1 day Timing:  Constant Progression:  Waxing and waning Chronicity:  Recurrent Associated symptoms: no fever     Past Medical History:  Diagnosis Date  . Medical history non-contributory     There are no active problems to display for this patient.   Past Surgical History:  Procedure Laterality Date  . TONSILLECTOMY AND ADENOIDECTOMY Bilateral 08/09/2013   Procedure: TONSILLECTOMY AND ADENOIDECTOMY;  Surgeon: Melvenia BeamMitchell Gore, MD;  [facility];  Service: ENT;  Laterality: Bilateral;  . WISDOM TOOTH EXTRACTION       OB History   No obstetric history on file.      Home Medications    Prior to Admission  medications   Medication Sig Start Date End Date Taking? Authorizing Provider  doxycycline (VIBRAMYCIN) 100 MG capsule Take 1 capsule (100 mg total) by mouth 2 (two) times daily. 05/23/18   Rise MuLeaphart, Kenneth T, PA-C  metroNIDAZOLE (FLAGYL) 500 MG tablet Take 1 tablet (500 mg total) by mouth 2 (two) times daily with a meal. DO NOT CONSUME ALCOHOL WHILE TAKING THIS MEDICATION. 05/23/18   Rise MuLeaphart, Kenneth T, PA-C  Multiple Vitamins-Minerals (WOMENS MULTI VITAMIN & MINERAL PO) Take 2 tablets by mouth daily.    [provider]    Family History No family history on file.  Social History Social History   Tobacco Use  . Smoking status: Current Some Day Smoker  . Tobacco comment: smokes sometimes with ETOH consumption  Substance Use Topics  . Alcohol use: Yes    Comment: on weekends  . Drug use: Yes    Types: Marijuana     Allergies   Shellfish allergy   Review of Systems Review of Systems  Constitutional: Negative for fever.  All other systems reviewed and are negative.    Physical Exam Updated Vital Signs BP 126/89 (BP Location: Right Arm)   Pulse 81   Temp 97.8 F (36.6 C) (Oral)   Resp 15   Ht 5\' 8"  (1.727 m)   Wt 59.4 kg   LMP 03/10/2019 (Exact Date)   SpO2 100%   BMI 19.92 kg/m   Physical Exam Vitals signs and nursing note  reviewed.  Constitutional:      General: She is not in acute distress.    Appearance: She is well-developed.  HENT:     Head: Normocephalic and atraumatic.  Eyes:     Conjunctiva/sclera: Conjunctivae normal.     Pupils: Pupils are equal, round, and reactive to light.  Neck:     Musculoskeletal: Normal range of motion and neck supple.  Cardiovascular:     Rate and Rhythm: Normal rate and regular rhythm.     Heart sounds: Normal heart sounds.  Pulmonary:     Effort: Pulmonary effort is normal. No respiratory distress.     Breath sounds: Normal breath sounds.  Abdominal:     General: There is no distension.     Palpations:  Abdomen is soft.     Tenderness: There is no abdominal tenderness.  Genitourinary:    Comments: Patient declines pelvic exam Musculoskeletal: Normal range of motion.        General: No deformity.  Skin:    General: Skin is warm and dry.  Neurological:     Mental Status: She is alert and oriented to person, place, and time.      ED Treatments / Results  Labs (all labs ordered are listed, but only abnormal results are displayed) Labs Reviewed - No data to display  EKG None  Radiology No results found.  Procedures Procedures (including critical care time)  Medications Ordered in ED Medications  azithromycin (ZITHROMAX) powder 1 g (has no administration in time range)  cefTRIAXone (ROCEPHIN) injection 250 mg (has no administration in time range)     Initial Impression / Assessment and Plan / ED Course  I have reviewed the triage vital signs and the nursing notes.  Pertinent labs & imaging results that were available during my care of the patient were reviewed by me and considered in my medical decision making (see chart for details).        MDM  Screen complete  Gabriela [name] was evaluated in Emergency Department on 03/16/2019 for the symptoms described in the history of present illness. She was evaluated in the context of the global COVID-19 pandemic, which necessitated consideration that the patient might be at risk for infection with the SARS-CoV-2 virus that causes COVID-19. Institutional protocols and algorithms that pertain to the evaluation of patients at risk for COVID-19 are in a state of rapid change based on information released by regulatory bodies including the CDC and federal and state organizations. These policies and algorithms were followed during the patient's care in the ED.  Patient is presenting for evaluation following reported exposure to a partner who was diagnosed with chlamydia.  She request treatment for same.  She is otherwise without complaint.   She denies fever.  She is without vaginal discharge.  She did decline a pelvic exam.  She is adamantly certain that she is not pregnant with active use of birth control and she is currently on her period.  Patient was treated in the ED.  She will be prescribed doxycycline.  She does have a routine GYN who she will follow-up with.   Final Clinical Impressions(s) / ED Diagnoses   Final diagnoses:  STD exposure    ED Discharge Orders         Ordered    doxycycline (VIBRAMYCIN) 100 MG capsule  2 times daily     03/16/19 4656           Wynetta Fines, MD 03/16/19 [phone]

## 2019-08-06 ENCOUNTER — Emergency Department (HOSPITAL_COMMUNITY)
Admission: EM | Admit: 2019-08-06 | Discharge: 2019-08-06 | Discharge status: Against Medical Advice | Payer: Self-pay | Attending: Emergency Medicine | Admitting: Emergency Medicine

## 2019-08-06 ENCOUNTER — Emergency Department (HOSPITAL_COMMUNITY): Payer: Self-pay

## 2019-08-06 ENCOUNTER — Other Ambulatory Visit: Payer: Self-pay

## 2019-08-06 ENCOUNTER — Encounter (HOSPITAL_COMMUNITY): Payer: Self-pay | Admitting: Emergency Medicine

## 2019-08-06 DIAGNOSIS — R05 Cough: Secondary | ICD-10-CM | POA: Insufficient documentation

## 2019-08-06 DIAGNOSIS — Z20828 Contact with and (suspected) exposure to other viral communicable diseases: Secondary | ICD-10-CM | POA: Diagnosis not present

## 2019-08-06 DIAGNOSIS — R509 Fever, unspecified: Secondary | ICD-10-CM | POA: Diagnosis not present

## 2019-08-06 DIAGNOSIS — Z5321 Procedure and treatment not carried out due to patient leaving prior to being seen by health care provider: Secondary | ICD-10-CM | POA: Insufficient documentation

## 2019-08-06 DIAGNOSIS — J069 Acute upper respiratory infection, unspecified: Secondary | ICD-10-CM | POA: Diagnosis not present

## 2019-08-06 NOTE — ED Triage Notes (Signed)
Pt c/o body aches and chills since yesterday, and cough which got worse today.

## 2019-08-06 NOTE — ED Notes (Signed)
Pt called from the lobby with no response x3 

## 2019-08-06 NOTE — ED Notes (Signed)
Pt called from the lobby with no response x2 

## 2019-08-06 NOTE — ED Notes (Signed)
Pt called from the lobby with no response 

## 2019-10-02 DIAGNOSIS — Z6825 Body mass index (BMI) 25.0-25.9, adult: Secondary | ICD-10-CM | POA: Diagnosis not present

## 2019-10-02 DIAGNOSIS — N760 Acute vaginitis: Secondary | ICD-10-CM | POA: Diagnosis not present

## 2019-10-02 DIAGNOSIS — N921 Excessive and frequent menstruation with irregular cycle: Secondary | ICD-10-CM | POA: Diagnosis not present

## 2019-10-02 DIAGNOSIS — Z118 Encounter for screening for other infectious and parasitic diseases: Secondary | ICD-10-CM | POA: Diagnosis not present

## 2019-10-02 DIAGNOSIS — Z124 Encounter for screening for malignant neoplasm of cervix: Secondary | ICD-10-CM | POA: Diagnosis not present

## 2019-10-02 DIAGNOSIS — R87610 Atypical squamous cells of undetermined significance on cytologic smear of cervix (ASC-US): Secondary | ICD-10-CM | POA: Diagnosis not present

## 2019-10-02 DIAGNOSIS — Z01419 Encounter for gynecological examination (general) (routine) without abnormal findings: Secondary | ICD-10-CM | POA: Diagnosis not present

## 2019-10-18 DIAGNOSIS — N760 Acute vaginitis: Secondary | ICD-10-CM | POA: Diagnosis not present

## 2019-10-18 DIAGNOSIS — B373 Candidiasis of vulva and vagina: Secondary | ICD-10-CM | POA: Diagnosis not present

## 2019-11-07 DIAGNOSIS — N760 Acute vaginitis: Secondary | ICD-10-CM | POA: Diagnosis not present

## 2019-11-07 DIAGNOSIS — L292 Pruritus vulvae: Secondary | ICD-10-CM | POA: Diagnosis not present

## 2020-02-28 DIAGNOSIS — Z0000 Encounter for general adult medical examination without abnormal findings: Secondary | ICD-10-CM | POA: Diagnosis not present

## 2020-02-28 DIAGNOSIS — E663 Overweight: Secondary | ICD-10-CM | POA: Diagnosis not present

## 2020-02-28 DIAGNOSIS — Z23 Encounter for immunization: Secondary | ICD-10-CM | POA: Diagnosis not present

## 2020-02-28 DIAGNOSIS — Z3009 Encounter for other general counseling and advice on contraception: Secondary | ICD-10-CM | POA: Diagnosis not present

## 2020-02-28 DIAGNOSIS — N898 Other specified noninflammatory disorders of vagina: Secondary | ICD-10-CM | POA: Diagnosis not present

## 2020-03-27 DIAGNOSIS — H01001 Unspecified blepharitis right upper eyelid: Secondary | ICD-10-CM | POA: Diagnosis not present

## 2020-07-27 DIAGNOSIS — Z20822 Contact with and (suspected) exposure to covid-19: Secondary | ICD-10-CM | POA: Diagnosis not present

## 2020-09-17 DIAGNOSIS — Z20822 Contact with and (suspected) exposure to covid-19: Secondary | ICD-10-CM | POA: Diagnosis not present

## 2022-10-16 ENCOUNTER — Other Ambulatory Visit: Payer: Self-pay

## 2022-10-16 ENCOUNTER — Encounter (HOSPITAL_BASED_OUTPATIENT_CLINIC_OR_DEPARTMENT_OTHER): Payer: Self-pay | Admitting: Emergency Medicine

## 2022-10-16 ENCOUNTER — Emergency Department (HOSPITAL_BASED_OUTPATIENT_CLINIC_OR_DEPARTMENT_OTHER)
Admission: EM | Admit: 2022-10-16 | Discharge: 2022-10-16 | Disposition: A | Discharge status: Home / Self Care | Payer: Self-pay | Attending: Emergency Medicine | Admitting: Emergency Medicine

## 2022-10-16 DIAGNOSIS — Z20822 Contact with and (suspected) exposure to covid-19: Secondary | ICD-10-CM | POA: Insufficient documentation

## 2022-10-16 DIAGNOSIS — J069 Acute upper respiratory infection, unspecified: Secondary | ICD-10-CM | POA: Insufficient documentation

## 2022-10-16 DIAGNOSIS — R051 Acute cough: Secondary | ICD-10-CM

## 2022-10-16 LAB — RESP PANEL BY RT-PCR (FLU A&B, COVID) ARPGX2
Influenza A by PCR: NEGATIVE
Influenza B by PCR: NEGATIVE
SARS Coronavirus 2 by RT PCR: NEGATIVE

## 2022-10-16 NOTE — Discharge Instructions (Addendum)
Take OTC Mucinex for symptom relief. Please take tylenol/ibuprofen for pain. I recommend close follow-up with PCP for reevaluation.  Please do not hesitate to return to emergency department if worrisome signs symptoms we discussed become apparent.

## 2022-10-16 NOTE — ED Triage Notes (Signed)
Since Monday has had a cough and is unable to sleep. Brownish mucus at times

## 2022-10-16 NOTE — ED Provider Notes (Signed)
[facility] EMERGENCY DEPARTMENT Provider Note   CSN: [id number] Arrival date & time: 10/16/22  0857     History  Chief Complaint  Patient presents with   Cough    Gabriela [name] is a 31 y.o. female. With no significant past medical history presents with URI symptoms.  Patient states she has had dry cough, runny nose, congestion, nausea, headache, body aches since Sunday after returning from [place].  Patient has tried TheraFlu with minimal improvement.  States the cough causing some chest pain.  She is not sure if she is exposed to anyone who is sick.  Denies shortness of breath, dizziness, bowel changes, urinary symptoms, rash.   Cough     Past Medical History:  Diagnosis Date   Medical history non-contributory    Past Surgical History:  Procedure Laterality Date   TONSILLECTOMY AND ADENOIDECTOMY Bilateral 08/09/2013   Procedure: TONSILLECTOMY AND ADENOIDECTOMY;  Surgeon: Melvenia Beam, MD;  Location: [facility] OR;  Service: ENT;  Laterality: Bilateral;   WISDOM TOOTH EXTRACTION       Home Medications Prior to Admission medications   Medication Sig Start Date End Date Taking? Authorizing Provider  doxycycline (VIBRAMYCIN) 100 MG capsule Take 1 capsule (100 mg total) by mouth 2 (two) times daily. 05/23/18   Rise Mu, PA-C  doxycycline (VIBRAMYCIN) 100 MG capsule Take 1 capsule (100 mg total) by mouth 2 (two) times daily. 03/16/19   Wynetta Fines, MD  metroNIDAZOLE (FLAGYL) 500 MG tablet Take 1 tablet (500 mg total) by mouth 2 (two) times daily with a meal. DO NOT CONSUME ALCOHOL WHILE TAKING THIS MEDICATION. 05/23/18   Rise Mu, PA-C  Multiple Vitamins-Minerals (WOMENS MULTI VITAMIN & MINERAL PO) Take 2 tablets by mouth daily.    [provider]      Allergies    Shellfish allergy    Review of Systems   Review of Systems  Respiratory:  Positive for cough.   Negative except as per HPI.   Physical Exam Updated Vital Signs BP (!)  135/102 (BP Location: Right Arm)   Pulse 76   Temp 98.4 F (36.9 C) (Oral)   Resp 18   Ht 5\' 8"  (1.727 m)   Wt 75.8 kg   LMP 10/07/2022   SpO2 100%   BMI 25.39 kg/m  Physical Exam Vitals and nursing note reviewed.  Constitutional:      Appearance: Normal appearance.  HENT:     Head: Normocephalic and atraumatic.     Mouth/Throat:     Mouth: Mucous membranes are moist.  Eyes:     General: No scleral icterus. Cardiovascular:     Rate and Rhythm: Normal rate and regular rhythm.     Pulses: Normal pulses.     Heart sounds: Normal heart sounds.  Pulmonary:     Effort: Pulmonary effort is normal.     Breath sounds: Normal breath sounds.  Abdominal:     General: Abdomen is flat.     Palpations: Abdomen is soft.     Tenderness: There is no abdominal tenderness.  Musculoskeletal:        General: No deformity.  Skin:    General: Skin is warm.     Findings: No rash.  Neurological:     General: No focal deficit present.     Mental Status: She is alert.  Psychiatric:        Mood and Affect: Mood normal.     ED Results / Procedures / Treatments  Labs (all labs ordered are listed, but only abnormal results are displayed) Labs Reviewed  RESP PANEL BY RT-PCR (FLU A&B, COVID) [id number]    EKG None  Radiology No results found.  Procedures Procedures    Medications Ordered in ED Medications - No data to display  ED Course/ Medical Decision Making/ A&P                           Medical Decision Making  This patient presents to the ED for URI symtoms, this involves an extensive number of treatment options, and is a complaint that carries with a high risk of complications and morbidity.  The differential diagnosis includes viral URI, flu, COVID, bronchitis, strep, pharyngitis.  This is not an exhaustive list.  Comorbidities that complicate the patient evaluation See HPI  Social determinants of health NA  Additional history obtained: External records from outside  source obtained and reviewed including: Chart review including previous notes, labs, imaging.  Cardiac monitoring/EKG: The patient was maintained on a cardiac monitor.  I personally reviewed and interpreted the cardiac monitor which showed an underlying rhythm of: Sinus rhythm.  Lab tests: I ordered and personally interpreted labs.  The pertinent results include: Respiratory panel negative.  Imaging studies:  Problem list/ ED course/ Critical interventions/ Medical management: HPI: See above Vital signs otherwise within normal range and stable throughout visit. Laboratory/imaging studies significant for: See above. On physical examination, patient is afebrile and appears in no acute distress.  Heart sounds normal.  Lungs are clear, no wheezes or rales.  Respiratory panel negative for COVID, flu.  Based on patient's clinical presentations and laboratory/imaging studies I suspect viral upper respiratory infection.  Given duration of symptoms and physical examination, I do not think is necessary to pursue chest x-ray or antibiotic treatment at this point.  Advised patient to take OTC Mucinex for symptom relief.  Take Tylenol/ibuprofen for fever or pain.  Follow-up with PCP for further evaluation management.  Return to the ER if new or worsening symptoms.. I have reviewed the patient home medicines and have made adjustments as needed.  Consultations obtained:  Disposition Continued outpatient therapy. Follow-up with PCP recommended for reevaluation of symptoms. Treatment plan discussed with patient.  Pt acknowledged understanding was agreeable to the plan. Worrisome signs and symptoms were discussed with patient, and patient acknowledged understanding to return to the ED if they noticed these signs and symptoms. Patient was stable upon discharge.   This chart was dictated using voice recognition software.  Despite best efforts to proofread,  errors can occur which can change the documentation  meaning.          Final Clinical Impression(s) / ED Diagnoses Final diagnoses:  Acute cough  Viral upper respiratory tract infection    Rx / DC Orders ED Discharge Orders     None         Jeanelle Malling, [place] 10/16/22 1815    Cathren Laine, MD 10/17/22 2330

## 2023-04-06 ENCOUNTER — Encounter (HOSPITAL_BASED_OUTPATIENT_CLINIC_OR_DEPARTMENT_OTHER): Payer: Self-pay

## 2023-04-06 ENCOUNTER — Other Ambulatory Visit: Payer: Self-pay

## 2023-04-06 ENCOUNTER — Emergency Department (HOSPITAL_BASED_OUTPATIENT_CLINIC_OR_DEPARTMENT_OTHER): Payer: Managed Care, Other (non HMO)

## 2023-04-06 ENCOUNTER — Emergency Department (HOSPITAL_BASED_OUTPATIENT_CLINIC_OR_DEPARTMENT_OTHER)
Admission: EM | Admit: 2023-04-06 | Discharge: 2023-04-07 | Disposition: A | Discharge status: Home / Self Care | Payer: Managed Care, Other (non HMO) | Attending: Emergency Medicine | Admitting: Emergency Medicine

## 2023-04-06 DIAGNOSIS — S93401A Sprain of unspecified ligament of right ankle, initial encounter: Secondary | ICD-10-CM | POA: Diagnosis not present

## 2023-04-06 DIAGNOSIS — M25571 Pain in right ankle and joints of right foot: Secondary | ICD-10-CM | POA: Diagnosis present

## 2023-04-06 DIAGNOSIS — X501XXA Overexertion from prolonged static or awkward postures, initial encounter: Secondary | ICD-10-CM | POA: Insufficient documentation

## 2023-04-06 LAB — PREGNANCY, URINE: Preg Test, Ur: NEGATIVE

## 2023-04-06 MED ORDER — LIDOCAINE 5 % EX PTCH
1.0000 | MEDICATED_PATCH | CUTANEOUS | Status: DC
  Administered 2023-04-06: 1 via TRANSDERMAL
  Filled 2023-04-06: qty 1

## 2023-04-06 MED ORDER — LIDOCAINE 5 % EX PTCH: 1.0000 | MEDICATED_PATCH | CUTANEOUS | 0 refills | Status: AC

## 2023-04-06 MED ORDER — NAPROXEN 250 MG PO TABS
500.0000 mg | ORAL_TABLET | Freq: Once | ORAL | Status: AC
  Administered 2023-04-06: 500 mg via ORAL
  Filled 2023-04-06: qty 2

## 2023-04-06 MED ORDER — NAPROXEN 500 MG PO TABS: 500.0000 mg | ORAL_TABLET | Freq: Two times a day (BID) | ORAL | 0 refills | Status: AC

## 2023-04-06 NOTE — ED Triage Notes (Signed)
Pt complaining of pain in the rt ankle that started 11 am when she twisted it. Is not able to bear weight.

## 2023-04-06 NOTE — ED Provider Notes (Signed)
[place] [facility]ERGENC[facility] DEPARTMENT AT [facility] Provider Note   CSN: [id number] Arrival date & time: 04/06/23  2159     History  Chief Complaint  Patient presents with   Ankle Pain    Gabriela [name] is a 32 y.o. female.  The history is provided by the patient.  Ankle Pain Location:  Ankle Time since incident:  13 hours Ankle location:  R ankle Pain details:    Quality:  Aching   Radiates to:  Does not radiate   Onset quality:  Sudden   Timing:  Constant   Progression:  Unchanged Chronicity:  New Relieved by:  Nothing Worsened by:  Nothing Ineffective treatments:  None tried Associated symptoms: no fever   Risk factors: no concern for non-accidental trauma        Home Medications Prior to Admission medications   Medication Sig Start Date End Date Taking? Authorizing Provider  lidocaine (LIDODERM) 5 % Place 1 patch onto the skin daily. Remove & Discard patch within 12 hours or as directed by MD 04/06/23  Yes Ronit Cranfield, MD  naproxen (NAPROSYN) 500 MG tablet Take 1 tablet (500 mg total) by mouth 2 (two) times daily with a meal. 04/06/23  Yes Chalisa Kobler, MD  doxycycline (VIBRAMYCIN) 100 MG capsule Take 1 capsule (100 mg total) by mouth 2 (two) times daily. 05/23/18   Rise Mu, PA-C  doxycycline (VIBRAMYCIN) 100 MG capsule Take 1 capsule (100 mg total) by mouth 2 (two) times daily. 03/16/19   Wynetta Fines, MD  metroNIDAZOLE (FLAGYL) 500 MG tablet Take 1 tablet (500 mg total) by mouth 2 (two) times daily with a meal. DO NOT CONSUME ALCOHOL WHILE TAKING THIS MEDICATION. 05/23/18   Rise Mu, PA-C  Multiple Vitamins-Minerals (WOMENS MULTI VITAMIN & MINERAL PO) Take 2 tablets by mouth daily.    [provider]      Allergies    Shellfish allergy    Review of Systems   Review of Systems  Constitutional:  Negative for fever.  Gastrointestinal:  Negative for vomiting.  Musculoskeletal:  Positive for arthralgias.  All other  systems reviewed and are negative.   Physical Exam Updated Vital Signs BP (!) 115/105 (BP Location: Right Arm)   Pulse 92   Temp 97.7 F (36.5 C)   Resp 18   Ht 5\' 9"  (1.753 m)   Wt 81.6 kg   LMP 03/16/2023   SpO2 99%   BMI 26.58 kg/m  Physical Exam Vitals and nursing note reviewed.  Constitutional:      General: She is not in acute distress.    Appearance: She is well-developed.  HENT:     Head: Normocephalic and atraumatic.  Eyes:     Pupils: Pupils are equal, round, and reactive to light.  Cardiovascular:     Rate and Rhythm: Normal rate and regular rhythm.     Pulses: Normal pulses.     Heart sounds: Normal heart sounds.  Pulmonary:     Effort: Pulmonary effort is normal. No respiratory distress.     Breath sounds: Normal breath sounds.  Abdominal:     General: Bowel sounds are normal. There is no distension.     Palpations: Abdomen is soft.     Tenderness: There is no abdominal tenderness. There is no guarding or rebound.  Genitourinary:    Vagina: No vaginal discharge.  Musculoskeletal:        General: Normal range of motion.     Cervical back: Neck  supple.     Right knee: Normal.     Right lower leg: Normal.     Right ankle: No deformity, ecchymosis or lacerations. Anterior drawer test negative. Normal pulse.     Right Achilles Tendon: Normal.     Right foot: Normal.  Skin:    General: Skin is warm and dry.     Capillary Refill: Capillary refill takes less than 2 seconds.     Findings: No erythema or rash.  Neurological:     General: No focal deficit present.     Deep Tendon Reflexes: Reflexes normal.  Psychiatric:        Mood and Affect: Mood normal.     ED Results / Procedures / Treatments   Labs (all labs ordered are listed, but only abnormal results are displayed)  Results for orders placed or performed during the hospital encounter of 04/06/23  Pregnancy, urine  Result Value Ref Range   Preg Test, Ur NEGATIVE NEGATIVE   DG Ankle Complete  Right  Result Date: 04/06/2023 CLINICAL DATA:  Recent fall with ankle pain, initial encounter EXAM: RIGHT ANKLE - COMPLETE 3+ VIEW COMPARISON:  None Available. FINDINGS: Lateral soft tissue swelling is noted. No acute fracture or dislocation is seen. No other focal abnormality is noted. IMPRESSION: Lateral soft tissue swelling without acute bony abnormality. Electronically Signed   By: Alcide Clever M.D.   On: 04/06/2023 22:44    04/06/2023 Radiology DG Ankle Complete Right  Result Date: 04/06/2023 CLINICAL DATA:  Recent fall with ankle pain, initial encounter EXAM: RIGHT ANKLE - COMPLETE 3+ VIEW COMPARISON:  None Available. FINDINGS: Lateral soft tissue swelling is noted. No acute fracture or dislocation is seen. No other focal abnormality is noted. IMPRESSION: Lateral soft tissue swelling without acute bony abnormality. Electronically Signed   By: Alcide Clever M.D.   On: 04/06/2023 22:44    Procedures Procedures    Medications Ordered in ED Medications  naproxen (NAPROSYN) tablet 500 mg (has no administration in time range)  lidocaine (LIDODERM) 5 % 1 patch (has no administration in time range)    ED Course/ Medical Decision Making/ A&P                             Medical Decision Making Twisted R ankle this am   Amount and/or Complexity of Data Reviewed External Data Reviewed: notes.    Details: Previous notes reviewed  Labs: ordered.    Details: Negative  Radiology: ordered and independent interpretation performed.    Details: Negative for fracture   Risk Prescription drug management. Risk Details: Well appearin, no fracture.  Ice elevation and NSAIDs.  Stable for discharge.  Strict return    Final Clinical Impression(s) / ED Diagnoses Final diagnoses:  Sprain of right ankle, unspecified ligament, initial encounter   Return for intractable cough, coughing up blood, fevers > 100.4 unrelieved by medication, shortness of breath, intractable vomiting, chest pain, shortness  of breath, weakness, numbness, changes in speech, facial asymmetry, abdominal pain, passing out, Inability to tolerate liquids or food, cough, altered mental status or any concerns. No signs of systemic illness or infection. The patient is nontoxic-appearing on exam and vital signs are within normal limits.  I have reviewed the triage vital signs and the nursing notes. Pertinent labs & imaging results that were available during my care of the patient were reviewed by me and considered in my medical decision making (see chart for details). After history,  exam, and medical workup I feel the patient has been appropriately medically screened and is safe for discharge home. Pertinent diagnoses were discussed with the patient. Patient was given return precautions.  Rx / DC Orders ED Discharge Orders          Ordered    naproxen (NAPROSYN) 500 MG tablet  2 times daily with meals        04/06/23 2338    lidocaine (LIDODERM) 5 %  Every 24 hours        04/06/23 2338              Shandria Clinch, MD 04/06/23 2340

## 2023-06-30 ENCOUNTER — Encounter (HOSPITAL_BASED_OUTPATIENT_CLINIC_OR_DEPARTMENT_OTHER): Payer: Self-pay

## 2023-06-30 ENCOUNTER — Other Ambulatory Visit: Payer: Self-pay

## 2023-06-30 ENCOUNTER — Emergency Department (HOSPITAL_BASED_OUTPATIENT_CLINIC_OR_DEPARTMENT_OTHER): Payer: Managed Care, Other (non HMO)

## 2023-06-30 ENCOUNTER — Emergency Department (HOSPITAL_BASED_OUTPATIENT_CLINIC_OR_DEPARTMENT_OTHER)
Admission: EM | Admit: 2023-06-30 | Discharge: 2023-06-30 | Disposition: A | Discharge status: Home / Self Care | Payer: Managed Care, Other (non HMO) | Attending: Emergency Medicine | Admitting: Emergency Medicine

## 2023-06-30 DIAGNOSIS — L03213 Periorbital cellulitis: Secondary | ICD-10-CM | POA: Insufficient documentation

## 2023-06-30 DIAGNOSIS — H0289 Other specified disorders of eyelid: Secondary | ICD-10-CM | POA: Diagnosis present

## 2023-06-30 LAB — CBC
HCT: 38.5 % (ref 36.0–46.0)
Hemoglobin: 12.9 g/dL (ref 12.0–15.0)
MCH: 31.1 pg (ref 26.0–34.0)
MCHC: 33.5 g/dL (ref 30.0–36.0)
MCV: 92.8 fL (ref 80.0–100.0)
Platelets: 252 10*3/uL (ref 150–400)
RBC: 4.15 MIL/uL (ref 3.87–5.11)
RDW: 14.9 % (ref 11.5–15.5)
WBC: 7.7 10*3/uL (ref 4.0–10.5)
nRBC: 0 % (ref 0.0–0.2)

## 2023-06-30 LAB — BASIC METABOLIC PANEL
Anion gap: 9 (ref 5–15)
BUN: 8 mg/dL (ref 6–20)
CO2: 24 mmol/L (ref 22–32)
Calcium: 8.5 mg/dL — ABNORMAL LOW (ref 8.9–10.3)
Chloride: 104 mmol/L (ref 98–111)
Creatinine, Ser: 0.67 mg/dL (ref 0.44–1.00)
GFR, Estimated: >60 mL/min (ref >60)
Glucose, Bld: 82 mg/dL (ref 70–99)
Potassium: 3.9 mmol/L (ref 3.5–5.1)
Sodium: 137 mmol/L (ref 135–145)

## 2023-06-30 MED ORDER — IOHEXOL 300 MG/ML  SOLN
75.0000 mL | Freq: Once | INTRAMUSCULAR | Status: AC | PRN
  Administered 2023-06-30: 75 mL via INTRAVENOUS

## 2023-06-30 MED ORDER — TETRACAINE HCL 0.5 % OP SOLN
2.0000 [drp] | Freq: Once | OPHTHALMIC | Status: DC
  Filled 2023-06-30: qty 4

## 2023-06-30 MED ORDER — AMOXICILLIN 500 MG PO CAPS: 1000.0000 mg | ORAL_CAPSULE | Freq: Two times a day (BID) | ORAL | 0 refills | Status: AC

## 2023-06-30 MED ORDER — FLUORESCEIN SODIUM 1 MG OP STRP
1.0000 | ORAL_STRIP | Freq: Once | OPHTHALMIC | Status: DC
  Filled 2023-06-30: qty 1

## 2023-06-30 MED ORDER — HYDROCODONE-ACETAMINOPHEN 5-325 MG PO TABS
1.0000 | ORAL_TABLET | Freq: Once | ORAL | Status: AC
  Administered 2023-06-30: 1 via ORAL
  Filled 2023-06-30: qty 1

## 2023-06-30 NOTE — Discharge Instructions (Addendum)
It was a pleasure taking part in your care today.  As we discussed, you do have preseptal cellulitis to your left eyelid.  Please begin taking amoxicillin 1000 g twice daily for the next 7 days.  This will be in addition to the clindamycin you are already taking.  Please finish both of these antibiotic prescriptions.  Please begin taking ibuprofen 600 mg or Tylenol 60 mg every 6 hours for pain.  If you begin developing any increased pain, fevers, drainage from your eye, increased pain with movement of your eye please return to the ED for further management.  Please read the attached guide concerning preseptal cellulitis.

## 2023-06-30 NOTE — ED Provider Notes (Signed)
[place] [facility] Provider Note   CSN: [id number] Arrival date & time: 06/30/23  1239     History  Chief Complaint  Patient presents with   Eye Pain    Gabriela [name] is a 32 y.o. female with no relevant medical history.  The patient presents to the ED for evaluation of left eyelid swelling and pain.  Patient reports that beginning last Thursday or Friday she developed swelling of her left upper eyelid, drainage to her left eye.  She reports that she went to urgent care on Sunday where she is diagnosed with "eyelid cellulitis" and prescribed oral clindamycin.  She states that she has been taking oral clindamycin as prescribed and reports that her redness, swelling and drainage has largely resolved.  She states that beginning last night and into this morning she has developed left-sided facial pain which she describes as a shooting pain.  She denies any foreign body sensation into her eye, drainage from her eye, redness.  She is endorsing painful EOMs.  She denies fevers, nausea or vomiting.  Denies contact use.   Eye Pain       Home Medications Prior to Admission medications   Medication Sig Start Date End Date Taking? Authorizing Provider  amoxicillin (AMOXIL) 500 MG capsule Take 2 capsules (1,000 mg total) by mouth 2 (two) times daily. 06/30/23  Yes Al Decant, PA-C  doxycycline (VIBRAMYCIN) 100 MG capsule Take 1 capsule (100 mg total) by mouth 2 (two) times daily. 05/23/18   Rise Mu, PA-C  doxycycline (VIBRAMYCIN) 100 MG capsule Take 1 capsule (100 mg total) by mouth 2 (two) times daily. 03/16/19   Wynetta Fines, MD  lidocaine (LIDODERM) 5 % Place 1 patch onto the skin daily. Remove & Discard patch within 12 hours or as directed by MD 04/06/23   Nicanor Alcon, April, MD  metroNIDAZOLE (FLAGYL) 500 MG tablet Take 1 tablet (500 mg total) by mouth 2 (two) times daily with a meal. DO NOT CONSUME ALCOHOL WHILE TAKING THIS MEDICATION.  05/23/18   Rise Mu, PA-C  Multiple Vitamins-Minerals (WOMENS MULTI VITAMIN & MINERAL PO) Take 2 tablets by mouth daily.    [provider]  naproxen (NAPROSYN) 500 MG tablet Take 1 tablet (500 mg total) by mouth 2 (two) times daily with a meal. 04/06/23   Palumbo, April, MD      Allergies    Shellfish allergy    Review of Systems   Review of Systems  Constitutional:  Negative for fever.  HENT:  Negative for sore throat.   Eyes:  Positive for pain, discharge and redness.  All other systems reviewed and are negative.   Physical Exam Updated Vital Signs BP (!) 135/97   Pulse 76   Temp 98.3 F (36.8 C) (Oral)   Resp 18   Ht 5\' 9"  (1.753 m)   Wt 81 kg   LMP 06/30/2023 (Exact Date)   SpO2 100%   BMI 26.37 kg/m  Physical Exam Vitals and nursing note reviewed.  Constitutional:      General: She is not in acute distress.    Appearance: Normal appearance. She is not ill-appearing, toxic-appearing or diaphoretic.  HENT:     Head: Normocephalic and atraumatic.     Nose: Nose normal.     Mouth/Throat:     Mouth: Mucous membranes are moist.     Pharynx: Oropharynx is clear.  Eyes:     General:  Right eye: No discharge.        Left eye: No discharge.     Extraocular Movements: Extraocular movements intact.     Conjunctiva/sclera: Conjunctivae normal.     Pupils: Pupils are equal, round, and reactive to light.     Comments: No appreciable discharge to left eye.  Pupils PERRL.  Here EOMs are intact however she states that they are painful.  Cardiovascular:     Rate and Rhythm: Normal rate and regular rhythm.  Pulmonary:     Effort: Pulmonary effort is normal.     Breath sounds: Normal breath sounds. No wheezing.  Abdominal:     General: Abdomen is flat.     Tenderness: There is no abdominal tenderness.  Musculoskeletal:     Cervical back: Normal range of motion and neck supple. No tenderness.  Skin:    Capillary Refill: Capillary refill takes less  than 2 seconds.  Neurological:     General: No focal deficit present.     Mental Status: She is alert and oriented to person, place, and time.     GCS: GCS eye subscore is 4. GCS verbal subscore is 5. GCS motor subscore is 6.     Cranial Nerves: Cranial nerves 2-12 are intact. No cranial nerve deficit.     Sensory: Sensation is intact. No sensory deficit.     Motor: Motor function is intact. No weakness.     Coordination: Coordination is intact. Heel to [facility] Test normal.     ED Results / Procedures / Treatments   Labs (all labs ordered are listed, but only abnormal results are displayed) Labs Reviewed  BASIC METABOLIC PANEL - Abnormal; Notable for the following components:      Result Value   Calcium 8.5 (*)    All other components within normal limits  CBC    EKG None  Radiology CT Orbits W Contrast  Result Date: 06/30/2023 CLINICAL DATA:  Periorbital cellulitis. Left-sided blurred vision with pain and light sensitivity. EXAM: CT ORBITS WITH CONTRAST TECHNIQUE: Multidetector CT images was performed according to the standard protocol following intravenous contrast administration. RADIATION DOSE REDUCTION: This exam was performed according to the departmental dose-optimization program which includes automated exposure control, adjustment of the mA and/or kV according to patient size and/or use of iterative reconstruction technique. CONTRAST:  75mL OMNIPAQUE IOHEXOL 300 MG/ML  SOLN COMPARISON:  None Available. FINDINGS: Orbits: Mild left orbital preseptal soft tissue thickening. No postseptal inflammation, fluid collection, or orbital mass. Grossly intact globes. Visible paranasal sinuses: The paranasal sinuses, middle ear cavities, and included mastoid air cells are clear. Soft tissues: Otherwise unremarkable. Osseous: No fracture or destructive process. Limited intracranial: Unremarkable. IMPRESSION: Mild left orbital preseptal soft tissue thickening. No postseptal inflammation or fluid  collection. Electronically Signed   By: Sebastian Ache M.D.   On: 06/30/2023 17:33    Procedures Procedures   Medications Ordered in ED Medications  HYDROcodone-acetaminophen (NORCO/VICODIN) 5-325 MG per tablet 1 tablet (1 tablet Oral Given 06/30/23 1433)  iohexol (OMNIPAQUE) 300 MG/ML solution 75 mL (75 mLs Intravenous Contrast Given 06/30/23 1535)    ED Course/ Medical Decision Making/ A&P  Medical Decision Making Risk Prescription drug management.   32 year old female presents to the ED for evaluation.  Please see HPI for further details.  On examination, patient has no obvious swelling to her left eyelid.  There is no obvious erythema to her left orbit, no drainage.  Her EOMs are intact however she states that they are painful.  She does have tenderness periorbitally.  She is overall nontoxic in appearance.  She is afebrile and nontachycardic.  Her neurological examination is reassuring.   Patient CBC shows no leukocytosis, no anemia.  Metabolic panel shows no electrolyte arrangement.  Patient CT orbits with contrast shows mild left orbital preseptal soft tissue thickening however no inflammation or fluid collection.  Patient states she is already on clindamycin.  Will add on amoxicillin 1000 milligrams twice daily for 7 days.  She was given strict return precautions to include increased pain, fevers, nausea or vomiting, redness, drainage from her left eye, increased pain with EOMs.  She voiced understanding.  She was advised to follow-up with her PCP for reevaluation upon completion of antibiotics.  Encouraged to return to the ED with any new or worsening signs or symptoms and she voiced understanding.  Stable to discharge at this time.  Final Clinical Impression(s) / ED Diagnoses Final diagnoses:  Preseptal cellulitis of left upper eyelid    Rx / DC Orders ED Discharge Orders          Ordered    amoxicillin (AMOXIL) 500 MG capsule  2 times daily        06/30/23 1752               Al Decant, PA-C 06/30/23 1756    Horton, Clabe Seal, DO 07/01/23 1711

## 2023-06-30 NOTE — ED Triage Notes (Signed)
The patient was dx with cellulitis to her left lid eye two days. She has been on antibiotics for two days. She is now having blurred vision in that eye, pain, light sensitively. She also states she having pain down into her face. No fever.
